# Patient Record
Sex: Male | Born: 1950 | Race: White | Hispanic: No | Marital: Married | Smoking: Former smoker
Health system: Southern US, Community
[De-identification: ages and names within clinical notes are randomized; demographics above are authoritative.]

## PROBLEM LIST (undated history)

## (undated) DIAGNOSIS — N289 Disorder of kidney and ureter, unspecified: Secondary | ICD-10-CM

## (undated) DIAGNOSIS — J189 Pneumonia, unspecified organism: Secondary | ICD-10-CM

## (undated) DIAGNOSIS — K219 Gastro-esophageal reflux disease without esophagitis: Secondary | ICD-10-CM

## (undated) DIAGNOSIS — I10 Essential (primary) hypertension: Secondary | ICD-10-CM

## (undated) DIAGNOSIS — G4730 Sleep apnea, unspecified: Secondary | ICD-10-CM

## (undated) DIAGNOSIS — D509 Iron deficiency anemia, unspecified: Secondary | ICD-10-CM

## (undated) DIAGNOSIS — D126 Benign neoplasm of colon, unspecified: Secondary | ICD-10-CM

## (undated) DIAGNOSIS — C689 Malignant neoplasm of urinary organ, unspecified: Secondary | ICD-10-CM

## (undated) DIAGNOSIS — Z5189 Encounter for other specified aftercare: Secondary | ICD-10-CM

## (undated) DIAGNOSIS — K635 Polyp of colon: Secondary | ICD-10-CM

## (undated) DIAGNOSIS — R51 Headache: Secondary | ICD-10-CM

## (undated) DIAGNOSIS — M797 Fibromyalgia: Secondary | ICD-10-CM

## (undated) DIAGNOSIS — K5790 Diverticulosis of intestine, part unspecified, without perforation or abscess without bleeding: Secondary | ICD-10-CM

## (undated) DIAGNOSIS — F32A Depression, unspecified: Secondary | ICD-10-CM

## (undated) DIAGNOSIS — C801 Malignant (primary) neoplasm, unspecified: Secondary | ICD-10-CM

## (undated) DIAGNOSIS — D649 Anemia, unspecified: Secondary | ICD-10-CM

## (undated) DIAGNOSIS — M1990 Unspecified osteoarthritis, unspecified site: Secondary | ICD-10-CM

## (undated) DIAGNOSIS — F329 Major depressive disorder, single episode, unspecified: Secondary | ICD-10-CM

## (undated) DIAGNOSIS — K900 Celiac disease: Secondary | ICD-10-CM

## (undated) DIAGNOSIS — K5520 Angiodysplasia of colon without hemorrhage: Secondary | ICD-10-CM

## (undated) DIAGNOSIS — K922 Gastrointestinal hemorrhage, unspecified: Secondary | ICD-10-CM

## (undated) DIAGNOSIS — IMO0001 Reserved for inherently not codable concepts without codable children: Secondary | ICD-10-CM

## (undated) HISTORY — DX: Diverticulosis of intestine, part unspecified, without perforation or abscess without bleeding: K57.90

## (undated) HISTORY — DX: Polyp of colon: K63.5

## (undated) HISTORY — PX: POLYPECTOMY: SHX149

## (undated) HISTORY — PX: COLONOSCOPY: SHX174

## (undated) HISTORY — DX: Benign neoplasm of colon, unspecified: D12.6

## (undated) HISTORY — DX: Encounter for other specified aftercare: Z51.89

## (undated) HISTORY — DX: Iron deficiency anemia, unspecified: D50.9

## (undated) HISTORY — DX: Celiac disease: K90.0

## (undated) HISTORY — DX: Fibromyalgia: M79.7

---
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
## 1982-02-14 HISTORY — PX: INNER EAR SURGERY: SHX679

## 1984-02-15 HISTORY — PX: VARICOSE VEIN SURGERY: SHX832

## 1998-02-14 HISTORY — PX: FOOT SURGERY: SHX648

## 2008-07-07 ENCOUNTER — Emergency Department (HOSPITAL_COMMUNITY): Admission: EM | Admit: 2008-07-07 | Discharge: 2008-07-07 | Payer: Self-pay | Admitting: Emergency Medicine

## 2009-08-21 ENCOUNTER — Other Ambulatory Visit: Payer: Self-pay | Admitting: Urology

## 2009-08-21 ENCOUNTER — Observation Stay (HOSPITAL_COMMUNITY): Admission: AD | Admit: 2009-08-21 | Discharge: 2009-08-22 | Payer: Self-pay | Admitting: Urology

## 2010-01-22 ENCOUNTER — Ambulatory Visit
Admission: RE | Admit: 2010-01-22 | Discharge: 2010-01-22 | Payer: Self-pay | Source: Home / Self Care | Attending: Urology | Admitting: Urology

## 2010-04-27 LAB — POCT I-STAT, CHEM 8
BUN: 21 mg/dL (ref 6–23)
Calcium, Ion: 1.19 mmol/L (ref 1.12–1.32)
Chloride: 103 mEq/L (ref 96–112)
Creatinine, Ser: 1 mg/dL (ref 0.4–1.5)
Glucose, Bld: 103 mg/dL — ABNORMAL HIGH (ref 70–99)
HCT: 34 % — ABNORMAL LOW (ref 39.0–52.0)
Hemoglobin: 11.6 g/dL — ABNORMAL LOW (ref 13.0–17.0)
Potassium: 3.7 mEq/L (ref 3.5–5.1)
Sodium: 140 mEq/L (ref 135–145)
TCO2: 28 mmol/L (ref 0–100)

## 2010-05-02 LAB — POCT I-STAT 4, (NA,K, GLUC, HGB,HCT)
Glucose, Bld: 107 mg/dL — ABNORMAL HIGH (ref 70–99)
HCT: 34 % — ABNORMAL LOW (ref 39.0–52.0)
Hemoglobin: 11.6 g/dL — ABNORMAL LOW (ref 13.0–17.0)
Potassium: 4.2 mEq/L (ref 3.5–5.1)
Sodium: 141 mEq/L (ref 135–145)

## 2010-05-25 LAB — URINALYSIS, ROUTINE W REFLEX MICROSCOPIC
Glucose, UA: NEGATIVE mg/dL
Ketones, ur: 15 mg/dL — AB
Nitrite: POSITIVE — AB
Protein, ur: 100 mg/dL — AB
Specific Gravity, Urine: 1.015 (ref 1.005–1.030)
Urobilinogen, UA: 1 mg/dL (ref 0.0–1.0)
pH: 5.5 (ref 5.0–8.0)

## 2010-05-25 LAB — DIFFERENTIAL
Basophils Absolute: 0 10*3/uL (ref 0.0–0.1)
Basophils Relative: 0 % (ref 0–1)
Eosinophils Absolute: 0 10*3/uL (ref 0.0–0.7)
Eosinophils Relative: 1 % (ref 0–5)
Lymphocytes Relative: 32 % (ref 12–46)
Lymphs Abs: 1.7 10*3/uL (ref 0.7–4.0)
Monocytes Absolute: 0.5 10*3/uL (ref 0.1–1.0)
Monocytes Relative: 9 % (ref 3–12)
Neutro Abs: 3.1 10*3/uL (ref 1.7–7.7)
Neutrophils Relative %: 57 % (ref 43–77)

## 2010-05-25 LAB — COMPREHENSIVE METABOLIC PANEL
ALT: 25 U/L (ref 0–53)
AST: 22 U/L (ref 0–37)
Albumin: 3.8 g/dL (ref 3.5–5.2)
Alkaline Phosphatase: 54 U/L (ref 39–117)
BUN: 10 mg/dL (ref 6–23)
CO2: 30 mEq/L (ref 19–32)
Calcium: 9.4 mg/dL (ref 8.4–10.5)
Chloride: 100 mEq/L (ref 96–112)
Creatinine, Ser: 0.9 mg/dL (ref 0.4–1.5)
GFR calc Af Amer: >60 mL/min (ref >60)
GFR calc non Af Amer: >60 mL/min (ref >60)
Glucose, Bld: 109 mg/dL — ABNORMAL HIGH (ref 70–99)
Potassium: 3.7 mEq/L (ref 3.5–5.1)
Sodium: 137 mEq/L (ref 135–145)
Total Bilirubin: 0.8 mg/dL (ref 0.3–1.2)
Total Protein: 6.5 g/dL (ref 6.0–8.3)

## 2010-05-25 LAB — URINE CULTURE: Colony Count: 5000

## 2010-05-25 LAB — CBC
HCT: 36 % — ABNORMAL LOW (ref 39.0–52.0)
Hemoglobin: 12.5 g/dL — ABNORMAL LOW (ref 13.0–17.0)
MCHC: 34.7 g/dL (ref 30.0–36.0)
MCV: 91.9 fL (ref 78.0–100.0)
Platelets: 265 10*3/uL (ref 150–400)
RBC: 3.92 MIL/uL — ABNORMAL LOW (ref 4.22–5.81)
RDW: 12.5 % (ref 11.5–15.5)
WBC: 5.3 10*3/uL (ref 4.0–10.5)

## 2010-05-25 LAB — URINE MICROSCOPIC-ADD ON

## 2010-05-27 ENCOUNTER — Ambulatory Visit (HOSPITAL_BASED_OUTPATIENT_CLINIC_OR_DEPARTMENT_OTHER)
Admission: RE | Admit: 2010-05-27 | Discharge: 2010-05-28 | Disposition: A | Discharge status: Home / Self Care | Payer: BC Managed Care – HMO | Source: Ambulatory Visit | Attending: Urology | Admitting: Urology

## 2010-05-27 ENCOUNTER — Other Ambulatory Visit: Payer: Self-pay | Admitting: Urology

## 2010-05-27 DIAGNOSIS — I10 Essential (primary) hypertension: Secondary | ICD-10-CM | POA: Insufficient documentation

## 2010-05-27 DIAGNOSIS — K219 Gastro-esophageal reflux disease without esophagitis: Secondary | ICD-10-CM | POA: Insufficient documentation

## 2010-05-27 DIAGNOSIS — G4733 Obstructive sleep apnea (adult) (pediatric): Secondary | ICD-10-CM | POA: Insufficient documentation

## 2010-05-27 DIAGNOSIS — C659 Malignant neoplasm of unspecified renal pelvis: Secondary | ICD-10-CM | POA: Insufficient documentation

## 2010-05-27 DIAGNOSIS — R319 Hematuria, unspecified: Secondary | ICD-10-CM | POA: Insufficient documentation

## 2010-05-27 DIAGNOSIS — Z01812 Encounter for preprocedural laboratory examination: Secondary | ICD-10-CM | POA: Insufficient documentation

## 2010-05-27 DIAGNOSIS — Z79899 Other long term (current) drug therapy: Secondary | ICD-10-CM | POA: Insufficient documentation

## 2010-05-27 LAB — POCT I-STAT 4, (NA,K, GLUC, HGB,HCT)
Glucose, Bld: 100 mg/dL — ABNORMAL HIGH (ref 70–99)
HCT: 29 % — ABNORMAL LOW (ref 39.0–52.0)
Hemoglobin: 9.9 g/dL — ABNORMAL LOW (ref 13.0–17.0)
Potassium: 5.5 mEq/L — ABNORMAL HIGH (ref 3.5–5.1)
Sodium: 136 mEq/L (ref 135–145)

## 2010-06-03 NOTE — Op Note (Signed)
  NAME:  George Jacobson, George Jacobson NO.:  [id number]  MEDICAL RECORD NO.:  [id number]          PATIENT TYPE:  AMB  LOCATION:  [facility]                         FACILITY:  [facility]  PHYSICIAN:  Maretta Bees. 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  PROCEDURES:  Cystoscopy, right ureteroscopy, evacuation of old clot from renal pelvis, biopsy of ? clot versus tissue, laser of right renal pelvis, right retrograde pyelogram with interpretation.  SURGEON:  Maretta Bees. Vonita Moss, M.D.  ANESTHESIA:  General.  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  He is brought back to the OR today because of recent episode of bleeding.  He has had no bleeding for several days.  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  DESCRIPTION OF PROCEDURE:  The patient was brought to the operating room, placed in a lithotomy position and the external genitalia prepped and draped in usual fashion.  He was cystoscoped and bladder was unremarkable.  I passed a guidewire easily up to the right renal pelvis and over that placed a digital ureteroscopic access sheath.  I then performed digital ureteroscopy and encountered the right renal pelvis to be filled with light brown old clot.  It took a lot of slow tedious irrigation and basketing to remove this clot.  I spent most of the time doing that.  After that, I did find the renal pelvis and caliceal system to be clear of tumor except for one area where I believe he had previous tumor.  There was possible soft tissue there with adherent clot.  I could not make a definitive diagnosis.  I  tried to basket some that tissue for biopsy and it was sent to the lab.  I then used the holmium laser to laser this area.  At this point, there was minimal bleeding and the ureteroscope and access sheath were removed.  As with his last procedure, I elected to leave out double-J catheters, he does not tolerate those well.  I will keep him for overnight observation because of his previous severe episodes of post ureteroscopic pain.  He tolerated the procedure well.     Maretta Bees. Vonita Moss, M.D.     LJP/MEDQ  D:  05/27/2010  T:  05/27/2010  Job:  [id number]  Electronically Signed by Larey Dresser M.D. on 06/03/2010 01:35:54 PM

## 2010-09-09 ENCOUNTER — Other Ambulatory Visit: Payer: Self-pay | Admitting: Urology

## 2010-09-09 ENCOUNTER — Ambulatory Visit (HOSPITAL_COMMUNITY): Payer: BC Managed Care – PPO

## 2010-09-09 ENCOUNTER — Observation Stay (HOSPITAL_COMMUNITY)
Admission: RE | Admit: 2010-09-09 | Discharge: 2010-09-11 | Disposition: A | Discharge status: Home / Self Care | Payer: BC Managed Care – PPO | Source: Ambulatory Visit | Attending: Urology | Admitting: Urology

## 2010-09-09 DIAGNOSIS — N3289 Other specified disorders of bladder: Secondary | ICD-10-CM | POA: Insufficient documentation

## 2010-09-09 DIAGNOSIS — G4730 Sleep apnea, unspecified: Secondary | ICD-10-CM | POA: Insufficient documentation

## 2010-09-09 DIAGNOSIS — Z79899 Other long term (current) drug therapy: Secondary | ICD-10-CM | POA: Insufficient documentation

## 2010-09-09 DIAGNOSIS — Z01810 Encounter for preprocedural cardiovascular examination: Secondary | ICD-10-CM | POA: Insufficient documentation

## 2010-09-09 DIAGNOSIS — Z7982 Long term (current) use of aspirin: Secondary | ICD-10-CM | POA: Insufficient documentation

## 2010-09-09 DIAGNOSIS — C659 Malignant neoplasm of unspecified renal pelvis: Principal | ICD-10-CM | POA: Insufficient documentation

## 2010-09-09 DIAGNOSIS — Z01818 Encounter for other preprocedural examination: Secondary | ICD-10-CM | POA: Insufficient documentation

## 2010-09-09 DIAGNOSIS — R109 Unspecified abdominal pain: Secondary | ICD-10-CM | POA: Insufficient documentation

## 2010-09-09 DIAGNOSIS — J984 Other disorders of lung: Secondary | ICD-10-CM | POA: Insufficient documentation

## 2010-09-09 DIAGNOSIS — I10 Essential (primary) hypertension: Secondary | ICD-10-CM | POA: Insufficient documentation

## 2010-09-09 DIAGNOSIS — K219 Gastro-esophageal reflux disease without esophagitis: Secondary | ICD-10-CM | POA: Insufficient documentation

## 2010-09-09 LAB — BASIC METABOLIC PANEL
BUN: 22 mg/dL (ref 6–23)
CO2: 27 mEq/L (ref 19–32)
Calcium: 9.5 mg/dL (ref 8.4–10.5)
Chloride: 101 mEq/L (ref 96–112)
Creatinine, Ser: 1.06 mg/dL (ref 0.50–1.35)
GFR calc Af Amer: >60 mL/min (ref >60)
GFR calc non Af Amer: >60 mL/min (ref >60)
Glucose, Bld: 90 mg/dL (ref 70–99)
Potassium: 3.7 mEq/L (ref 3.5–5.1)
Sodium: 136 mEq/L (ref 135–145)

## 2010-09-09 LAB — SURGICAL PCR SCREEN
MRSA, PCR: NEGATIVE
Staphylococcus aureus: POSITIVE — AB

## 2010-09-09 LAB — CBC
HCT: 33.4 % — ABNORMAL LOW (ref 39.0–52.0)
Hemoglobin: 11.7 g/dL — ABNORMAL LOW (ref 13.0–17.0)
MCH: 30.5 pg (ref 26.0–34.0)
MCHC: 35 g/dL (ref 30.0–36.0)
MCV: 87 fL (ref 78.0–100.0)
Platelets: 214 10*3/uL (ref 150–400)
RBC: 3.84 MIL/uL — ABNORMAL LOW (ref 4.22–5.81)
RDW: 12.3 % (ref 11.5–15.5)
WBC: 5.2 10*3/uL (ref 4.0–10.5)

## 2010-09-10 LAB — BASIC METABOLIC PANEL
BUN: 17 mg/dL (ref 6–23)
CO2: 27 mEq/L (ref 19–32)
Calcium: 8.4 mg/dL (ref 8.4–10.5)
Chloride: 103 mEq/L (ref 96–112)
Creatinine, Ser: 1.01 mg/dL (ref 0.50–1.35)
GFR calc Af Amer: >60 mL/min (ref >60)
GFR calc non Af Amer: >60 mL/min (ref >60)
Glucose, Bld: 170 mg/dL — ABNORMAL HIGH (ref 70–99)
Potassium: 3.8 mEq/L (ref 3.5–5.1)
Sodium: 137 mEq/L (ref 135–145)

## 2010-09-10 NOTE — Op Note (Signed)
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  PROCEDURE: 1. Cystoscopy. 2. Right retrograde pyelography with interpretation. 3. Right ureteroscopy with holmium laser ablation of renal pelvic     tumor. 4. Biopsy of right renal pelvic tumor. 5. Right renal pelvic washing for cytology. 6. Right ureteral stent placement (5 x 24 Polaris)  SURGEON:  Heloise Purpura, M.D.  ANESTHESIA:  General.  COMPLICATIONS:  None.  ESTIMATED BLOOD LOSS:  Minimal.  SPECIMENS: 1. Biopsy of right renal pelvic tumor. 2. Cytology from washing of right renal pelvis.  DISPOSITION OF SPECIMENS:  To pathology.  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 Vonita Moss.  He has undergone multiple laser ablation procedures and presents today for further surveillance and evaluation and possible further laser ablation.  This is his initial evaluation by me.  The potential risks, complications, and alternative treatment options associated with the above procedures were discussed in detail and informed consent obtained.  DESCRIPTION OF PROCEDURE:  The patient was taken to the operating room and a general anesthetic was administered.  He was given preoperative antibiotics, placed in the dorsal lithotomy position, and prepped and draped in the usual sterile fashion.  Next, a preoperative time-out was performed.  Cystourethroscopy was then performed, which revealed  a normal anterior and posterior urethra.  The bladder was then systematically inspected with both 12 and 70-degree lens and revealed no evidence of any bladder tumors, stones, or other mucosal pathology.  The ureteral orifices were in their expected locations with some blood seen from the right ureteral orifice and clear efflux from the left ureteral orifice.  A 6-French ureteral catheter was then used to intubate the right ureteral orifice and Omnipaque contrast was injected.  This demonstrated no ureteral abnormalities, but a large filling defect within the interpolar region of the right renal pelvis.  A 0.038 sensor guidewire was then advanced up the right ureter into the right renal pelvis under fluoroscopic guidance and a 12/14 ACMI ureteral access sheath was advanced over the wire using Seldinger technique up into the proximal ureter.  The digital flexible ureteroscope was then advanced through the ureteral access sheath into the renal pelvis and the renal collecting system was examined.  There was noted to be a large 2.5 to 3 cm papillary tumor in the renal pelvis with further papillary tumor located in an interpolar calyx.  A saline washing was obtained for cytology and biopsies were obtained with both the ureteral biopsy forceps and a Gemini stone basket.  The remaining tumor was then lasered using narrow band imaging to assist with visualization.  A 200-micron holmium laser fiber  on a setting of 1 joule at a rate of 6 Hz.  Once the tumor was sufficiently lasered, multiple of fragments of tumor were removed with the stone basket.  Reinspection revealed a decent amount of clot in the renal pelvis and this also was removed to the best of my ability.  There was noted to be some remaining frondular tissue in the interpolar calyx, which was unable to be accessed with the ureteroscope. It was unclear whether this was attached to the urothelium or simply free-floating frondular tissue  from prior ablated tissue.  At this point, it was decided to place a ureteral stent and to await the biopsy results and possibly proceed with further endoscopic evaluation versus more definitive major surgery for treatment pending these results.  A 0.038 sensor guidewire was then advanced up into the renal pelvis and the ureteral access sheath was removed.  This wire was back loaded on the cystoscope and a 5 x 24 double-J Polaris ureteral stent was advanced over the wire using Seldinger technique and positioned appropriately under fluoroscopic and cystoscopic guidance.  The wire was removed with a good curl noted in the renal pelvis.  Based on the fact that the patient did have significant hematuria, it was decided to leave an 55- [place] Foley catheter.  He tolerated procedure well without complications, was able to be transferred to recovery unit in satisfactory condition.     Heloise Purpura, MD     LB/MEDQ  D:  09/09/2010  T:  09/09/2010  Job:  [id number]  Electronically Signed by Heloise Purpura MD on 09/10/2010 11:03:32 PM

## 2010-09-12 NOTE — Discharge Summary (Signed)
  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  DISCHARGE DIAGNOSES: 1. 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 2. Pulmonary nodule.  HISTORY:  Mr. George Jacobson is a 60 year old gentleman who has a history of low- grade urothelial carcinoma of the right renal pelvis.  He has previously been treated endoscopically with laser ablation by Dr. Larey Dresser. He presented to me for initial consultation recently after Dr. Enos Fling retirement and it was recommended that he undergo surveillance ureteroscopy at this time.  HOSPITAL COURSE:  On September 09, 2010, he was taken to the operating room and underwent right ureteroscopy which revealed a 3-cm papillary tumor. Multiple biopsies were obtained and the tumor was ablated with holmium laser ablation.  A ureteral stent was left indwelling as was a Foley catheter and he was admitted for observation that evening.  He remained hemodynamically stable and his renal function was checked the following morning and remained stable at 1.01.  His urine cleared overnight.  His catheter was able to be removed on postoperative day #1.  He was able to pass a voiding trial and was to be discharged home except that he developed severe right flank pain.  This persisted throughout the day but was finally able to be controlled with po and IV pain medications. He was subsequently able to be discharged on  postoperative day #2 in stable condition.  DISPOSITION:  Home.  DISCHARGE MEDICATIONS:  He will resume all of his regular home medications and has been provided a prescription to take Vicodin for pain and  ciprofloxacin for antibiotic prophylaxis.  DISCHARGE INSTRUCTIONS:  He was instructed to resume a regular diet and activity as tolerated, although recommended to refrain from any heavy lifting or strenuous activity.  FOLLOWUP:  He will follow up in 2 weeks to review his biopsy results.  I have recommended that he proceed at the very least a repeat endoscopic evaluation in approximately 3 to 4 weeks.  He understands that if he does have a high-grade tumor that he would necessitate a nephroureterectomy.  ADDENDUM:  On the patient's preoperative chest x-ray, he was noted to have a 4-mm left upper lobe pulmonary nodule.  This was felt to potentially represent a vascular abnormality rather than a true nodule. He apparently has had prior chest x-rays in [place] and has been told of this finding previously.  It was not felt to represent a malignancy.  However, I will plan to send a note to the patient's primary care physician, Dr. Truddie Jacobson, notifying them of this result and to allow him to proceed with further surveillance or workup as necessary.  Heloise Purpura, MD     LB/MEDQ  D:  09/10/2010  T:  09/10/2010  Job:  [id number]  Electronically Signed by Heloise Purpura MD on 09/12/2010 11:01:21 AM

## 2010-09-24 ENCOUNTER — Other Ambulatory Visit: Payer: Self-pay | Admitting: Urology

## 2010-09-24 ENCOUNTER — Encounter (HOSPITAL_COMMUNITY): Payer: BC Managed Care – PPO

## 2010-09-24 LAB — CBC
HCT: 32.4 % — ABNORMAL LOW (ref 39.0–52.0)
Hemoglobin: 11.2 g/dL — ABNORMAL LOW (ref 13.0–17.0)
MCH: 30.1 pg (ref 26.0–34.0)
MCHC: 34.6 g/dL (ref 30.0–36.0)
MCV: 87.1 fL (ref 78.0–100.0)
Platelets: 264 10*3/uL (ref 150–400)
RBC: 3.72 MIL/uL — ABNORMAL LOW (ref 4.22–5.81)
RDW: 11.8 % (ref 11.5–15.5)
WBC: 6.1 10*3/uL (ref 4.0–10.5)

## 2010-09-24 LAB — BASIC METABOLIC PANEL
BUN: 21 mg/dL (ref 6–23)
CO2: 30 mEq/L (ref 19–32)
Calcium: 9.3 mg/dL (ref 8.4–10.5)
Chloride: 97 mEq/L (ref 96–112)
Creatinine, Ser: 1.17 mg/dL (ref 0.50–1.35)
GFR calc Af Amer: >60 mL/min (ref >60)
GFR calc non Af Amer: >60 mL/min (ref >60)
Glucose, Bld: 89 mg/dL (ref 70–99)
Potassium: 3.2 mEq/L — ABNORMAL LOW (ref 3.5–5.1)
Sodium: 136 mEq/L (ref 135–145)

## 2010-09-24 LAB — SURGICAL PCR SCREEN
MRSA, PCR: NEGATIVE
Staphylococcus aureus: NEGATIVE

## 2010-10-04 ENCOUNTER — Observation Stay (HOSPITAL_COMMUNITY)
Admission: RE | Admit: 2010-10-04 | Discharge: 2010-10-05 | Disposition: A | Discharge status: Home / Self Care | Payer: BC Managed Care – PPO | Source: Ambulatory Visit | Attending: Urology | Admitting: Urology

## 2010-10-04 DIAGNOSIS — Z01812 Encounter for preprocedural laboratory examination: Secondary | ICD-10-CM | POA: Insufficient documentation

## 2010-10-04 DIAGNOSIS — G4733 Obstructive sleep apnea (adult) (pediatric): Secondary | ICD-10-CM | POA: Insufficient documentation

## 2010-10-04 DIAGNOSIS — Z01810 Encounter for preprocedural cardiovascular examination: Secondary | ICD-10-CM | POA: Insufficient documentation

## 2010-10-04 DIAGNOSIS — I10 Essential (primary) hypertension: Secondary | ICD-10-CM | POA: Insufficient documentation

## 2010-10-04 DIAGNOSIS — C659 Malignant neoplasm of unspecified renal pelvis: Principal | ICD-10-CM | POA: Insufficient documentation

## 2010-10-04 NOTE — Op Note (Signed)
NAME:  George Jacobson, George Jacobson NO.:  [id number]  MEDICAL RECORD NO.:  [id number]  LOCATION:  1406                         FACILITY:  [facility]  PHYSICIAN:  Heloise Purpura, MD      DATE OF BIRTH:  Jun 25, 1950  DATE OF PROCEDURE:  10/04/2010 DATE OF DISCHARGE:                              OPERATIVE REPORT   PREOPERATIVE DIAGNOSIS:  Right renal pelvic tumor with history of low- grade urothelial carcinoma.  POSTOPERATIVE DIAGNOSIS:  Right renal pelvic tumor with history of low- grade urothelial carcinoma.  PROCEDURE: 1. Cystoscopy. 2. Right ureteroscopy with laser ablation of urothelial tumor. 3. Right ureteral stent placement (5 x 24 Polaris).  SURGEON:  Heloise Purpura, M.D.  ANESTHESIA:  General.  COMPLICATIONS:  None.  ESTIMATED BLOOD LOSS:  Minimal.  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  He initially presented to me last month and underwent an evaluation including an ureteroscopy with a fairly large papillary tumor.  Biopsies from this procedure demonstrated recurrent low-grade urothelial carcinoma.  The tumor was not able to be completely ablated and he presents today for further evaluation and possible ablation to determine if endoscopic therapy would be an appropriate approach to continue versus a more radical surgery.  The potential risks, complications, and alternative treatment options were discussed in detail and informed consent obtained.  DESCRIPTION OF PROCEDURE:  The patient was taken to the operating room and a general anesthetic was administered.  He was given preoperative antibiotics, placed in the dorsal lithotomy position, prepped and draped in the usual sterile fashion.  Next, cystourethroscopy was performed which revealed an unremarkable anterior and posterior urethra. Inspection of the bladder revealed no  evidence of any bladder tumors, stones, or other mucosal pathology.  The ureteral stent was identified and brought out to the urethral meatus with the aid of the flexible graspers and a 0.038 sensor guidewire was advanced through the stent up into the right renal pelvis under fluoroscopic guidance.  A 12/14 ACMI ureteral access sheath was then advanced over the wire to the proximal ureter and the digital flexible ureteroscope was advanced through the ureteral access sheath up into the right renal pelvis.  In the interpolar region of the kidney, there was noted to be some necrotic tumor from the prior ablation.  Inspection into this calix revealed significant amount of residual papillary tumor.  It was decided to make an attempt to proceed with further laser ablation based on the excellent visualization at this point.  A 200 micron holmium laser fiber was advanced through the ureteroscope and on a setting of 1 joule and 6 Hz, ablation was performed.  Unfortunately, based on the location of the tumor, it was not able to be completely accessed by ureteroscopic meansand it was eventually felt that continued ureteroscopic management would not be optimal.  At this point, laser ablation was discontinued.  There did not appear to be excessive bleeding from the renal pelvis.  The access sheath was removed after a 0.038 sensor guidewire was replaced. The wire was then back loaded on the cystoscope  and a new 5 x 24 Polaris ureteral stent was advanced over the wire using Seldinger technique.  It was positioned appropriately under fluoroscopic and cystoscopic guidance and the wire was removed.  The patient's bladder was emptied and the procedure was ended.     Heloise Purpura, MD     LB/MEDQ  D:  10/04/2010  T:  10/04/2010  Job:  [id number]  Electronically Signed by Heloise Purpura MD on 10/04/2010 06:50:09 PM

## 2010-10-05 LAB — BASIC METABOLIC PANEL
BUN: 13 mg/dL (ref 6–23)
CO2: 34 mEq/L — ABNORMAL HIGH (ref 19–32)
Calcium: 9.4 mg/dL (ref 8.4–10.5)
Chloride: 101 mEq/L (ref 96–112)
Creatinine, Ser: 1.07 mg/dL (ref 0.50–1.35)
GFR calc Af Amer: >60 mL/min (ref >60)
GFR calc non Af Amer: >60 mL/min (ref >60)
Glucose, Bld: 110 mg/dL — ABNORMAL HIGH (ref 70–99)
Potassium: 3.7 mEq/L (ref 3.5–5.1)
Sodium: 139 mEq/L (ref 135–145)

## 2010-10-09 NOTE — Discharge Summary (Signed)
  NAME:  George Jacobson, George Jacobson NO.:  [id number]  MEDICAL RECORD NO.:  [id number]  LOCATION:  1406                         FACILITY:  [facility]  PHYSICIAN:  Heloise Purpura, MD      DATE OF BIRTH:  Aug 24, 1950  DATE OF ADMISSION:  10/04/2010 DATE OF DISCHARGE:  10/05/2010                              DISCHARGE SUMMARY   ADMISSION DIAGNOSIS:  Right renal pelvic urothelial carcinoma.  POSTOPERATIVE/DISCHARGE DIAGNOSIS:  Right renal pelvic urothelial carcinoma.  HISTORY AND PHYSICAL:  For full details, please see admission history and physical.  Briefly, [name] is a 60 year old gentleman with a low- grade urothelial carcinoma of the right renal pelvis.  He has undergone ureteroscopic laser ablation procedures in the past and presents today for a second stage procedure to see if his tumor can be completely ablated endoscopically.  HOSPITAL COURSE:  On October 04, 2010, the patient was taken to the operating room.  His indwelling ureteral stent was removed and digital flexible ureteroscopy was performed, which revealed a large amount of residual urothelial carcinoma.  Attempts were made to further ablate this tumor with holmium laser.  However, due to the size as well as the location of the tumor, it was not able to be completely ablated.  At this point, it was decided to stop the procedure as it became evident that the patient would require more aggressive therapy with a nephroureterectomy.  He tolerated the procedure well and postoperatively was transferred to a regular hospital room following recovery from anesthesia.  He was able to begin ambulating and resume regular diet. He initially was noted to have fairly red urine when voiding, but this subsequently cleared overnight.  He was felt to be stable for discharge home the following morning after surgery.  DISPOSITION:  Home.  DISCHARGE MEDICATIONS:  He will resume all of his regular home medications.  He has also been  provided prescriptions to take Vicodin as needed for pain, Pyridium as needed for dysuria, and Cipro to take for antibiotic prophylaxis.  DISCHARGE INSTRUCTIONS:  He has been instructed to resume activity as tolerated and to resume a regular diet.  FOLLOWUP:  He will follow up in approximately 2 weeks for further postoperative evaluation and to discuss proceeding with a nephroureterectomy for definitive management of his renal pelvic tumor.     Heloise Purpura, MD     LB/MEDQ  D:  10/05/2010  T:  10/05/2010  Job:  [id number]  Electronically Signed by Heloise Purpura MD on 10/09/2010 06:43:17 PM

## 2010-10-16 DIAGNOSIS — C801 Malignant (primary) neoplasm, unspecified: Secondary | ICD-10-CM

## 2010-10-16 HISTORY — PX: LAPAROSCOPIC NEPHRECTOMY: SUR781

## 2010-10-16 HISTORY — DX: Malignant (primary) neoplasm, unspecified: C80.1

## 2010-11-02 ENCOUNTER — Other Ambulatory Visit: Payer: Self-pay | Admitting: Urology

## 2010-11-02 ENCOUNTER — Encounter (HOSPITAL_COMMUNITY): Payer: BC Managed Care – PPO

## 2010-11-02 LAB — BASIC METABOLIC PANEL
BUN: 21 mg/dL (ref 6–23)
CO2: 29 mEq/L (ref 19–32)
Calcium: 9.3 mg/dL (ref 8.4–10.5)
Chloride: 98 mEq/L (ref 96–112)
Creatinine, Ser: 1.04 mg/dL (ref 0.50–1.35)
GFR calc Af Amer: >60 mL/min (ref >60)
GFR calc non Af Amer: >60 mL/min (ref >60)
Glucose, Bld: 84 mg/dL (ref 70–99)
Potassium: 3.7 mEq/L (ref 3.5–5.1)
Sodium: 135 mEq/L (ref 135–145)

## 2010-11-02 LAB — CBC
HCT: 33.9 % — ABNORMAL LOW (ref 39.0–52.0)
Hemoglobin: 11.8 g/dL — ABNORMAL LOW (ref 13.0–17.0)
MCH: 30.8 pg (ref 26.0–34.0)
MCHC: 34.8 g/dL (ref 30.0–36.0)
MCV: 88.5 fL (ref 78.0–100.0)
Platelets: 225 10*3/uL (ref 150–400)
RBC: 3.83 MIL/uL — ABNORMAL LOW (ref 4.22–5.81)
RDW: 12.6 % (ref 11.5–15.5)
WBC: 5.5 10*3/uL (ref 4.0–10.5)

## 2010-11-02 LAB — SURGICAL PCR SCREEN
MRSA, PCR: NEGATIVE
Staphylococcus aureus: NEGATIVE

## 2010-11-02 LAB — ABO/RH: ABO/RH(D): O POS

## 2010-11-08 ENCOUNTER — Other Ambulatory Visit: Payer: Self-pay | Admitting: Urology

## 2010-11-08 ENCOUNTER — Inpatient Hospital Stay (HOSPITAL_COMMUNITY)
Admission: RE | Admit: 2010-11-08 | Discharge: 2010-11-12 | DRG: 303 | Disposition: A | Discharge status: Home / Self Care | Payer: BC Managed Care – PPO | Source: Ambulatory Visit | Attending: Urology | Admitting: Urology

## 2010-11-08 DIAGNOSIS — K219 Gastro-esophageal reflux disease without esophagitis: Secondary | ICD-10-CM | POA: Diagnosis present

## 2010-11-08 DIAGNOSIS — F3289 Other specified depressive episodes: Secondary | ICD-10-CM | POA: Diagnosis present

## 2010-11-08 DIAGNOSIS — M129 Arthropathy, unspecified: Secondary | ICD-10-CM | POA: Diagnosis present

## 2010-11-08 DIAGNOSIS — Z01812 Encounter for preprocedural laboratory examination: Secondary | ICD-10-CM

## 2010-11-08 DIAGNOSIS — N200 Calculus of kidney: Secondary | ICD-10-CM | POA: Diagnosis present

## 2010-11-08 DIAGNOSIS — E7800 Pure hypercholesterolemia, unspecified: Secondary | ICD-10-CM | POA: Diagnosis present

## 2010-11-08 DIAGNOSIS — Z7982 Long term (current) use of aspirin: Secondary | ICD-10-CM

## 2010-11-08 DIAGNOSIS — J45909 Unspecified asthma, uncomplicated: Secondary | ICD-10-CM | POA: Diagnosis present

## 2010-11-08 DIAGNOSIS — C659 Malignant neoplasm of unspecified renal pelvis: Principal | ICD-10-CM | POA: Diagnosis present

## 2010-11-08 DIAGNOSIS — R112 Nausea with vomiting, unspecified: Secondary | ICD-10-CM | POA: Diagnosis not present

## 2010-11-08 DIAGNOSIS — F329 Major depressive disorder, single episode, unspecified: Secondary | ICD-10-CM | POA: Diagnosis present

## 2010-11-08 DIAGNOSIS — G4733 Obstructive sleep apnea (adult) (pediatric): Secondary | ICD-10-CM | POA: Diagnosis present

## 2010-11-08 DIAGNOSIS — I10 Essential (primary) hypertension: Secondary | ICD-10-CM | POA: Diagnosis present

## 2010-11-08 LAB — BASIC METABOLIC PANEL
BUN: 16 mg/dL (ref 6–23)
CO2: 27 mEq/L (ref 19–32)
Calcium: 8.9 mg/dL (ref 8.4–10.5)
Chloride: 100 mEq/L (ref 96–112)
Creatinine, Ser: 1.34 mg/dL (ref 0.50–1.35)
GFR calc Af Amer: >60 mL/min (ref >60)
GFR calc non Af Amer: 54 mL/min — ABNORMAL LOW (ref >60)
Glucose, Bld: 123 mg/dL — ABNORMAL HIGH (ref 70–99)
Potassium: 3.9 mEq/L (ref 3.5–5.1)
Sodium: 136 mEq/L (ref 135–145)

## 2010-11-08 LAB — HEMOGLOBIN AND HEMATOCRIT, BLOOD
HCT: 30.8 % — ABNORMAL LOW (ref 39.0–52.0)
Hemoglobin: 10.8 g/dL — ABNORMAL LOW (ref 13.0–17.0)

## 2010-11-08 LAB — TYPE AND SCREEN
ABO/RH(D): O POS
Antibody Screen: NEGATIVE

## 2010-11-09 LAB — CBC
HCT: 29 % — ABNORMAL LOW (ref 39.0–52.0)
Hemoglobin: 10.3 g/dL — ABNORMAL LOW (ref 13.0–17.0)
MCH: 31.7 pg (ref 26.0–34.0)
MCHC: 35.5 g/dL (ref 30.0–36.0)
MCV: 89.2 fL (ref 78.0–100.0)
Platelets: 154 10*3/uL (ref 150–400)
RBC: 3.25 MIL/uL — ABNORMAL LOW (ref 4.22–5.81)
RDW: 12.4 % (ref 11.5–15.5)
WBC: 7 10*3/uL (ref 4.0–10.5)

## 2010-11-09 LAB — BASIC METABOLIC PANEL
BUN: 12 mg/dL (ref 6–23)
CO2: 29 mEq/L (ref 19–32)
Calcium: 8.3 mg/dL — ABNORMAL LOW (ref 8.4–10.5)
Chloride: 100 mEq/L (ref 96–112)
Creatinine, Ser: 1.52 mg/dL — ABNORMAL HIGH (ref 0.50–1.35)
GFR calc Af Amer: 57 mL/min — ABNORMAL LOW (ref >60)
GFR calc non Af Amer: 47 mL/min — ABNORMAL LOW (ref >60)
Glucose, Bld: 129 mg/dL — ABNORMAL HIGH (ref 70–99)
Potassium: 3.4 mEq/L — ABNORMAL LOW (ref 3.5–5.1)
Sodium: 134 mEq/L — ABNORMAL LOW (ref 135–145)

## 2010-11-10 LAB — CBC
HCT: 27.5 % — ABNORMAL LOW (ref 39.0–52.0)
Hemoglobin: 9.5 g/dL — ABNORMAL LOW (ref 13.0–17.0)
MCH: 30.9 pg (ref 26.0–34.0)
MCHC: 34.5 g/dL (ref 30.0–36.0)
MCV: 89.6 fL (ref 78.0–100.0)
Platelets: 145 10*3/uL — ABNORMAL LOW (ref 150–400)
RBC: 3.07 MIL/uL — ABNORMAL LOW (ref 4.22–5.81)
RDW: 12.3 % (ref 11.5–15.5)
WBC: 7.6 10*3/uL (ref 4.0–10.5)

## 2010-11-10 LAB — BASIC METABOLIC PANEL
BUN: 11 mg/dL (ref 6–23)
CO2: 31 mEq/L (ref 19–32)
Calcium: 8.3 mg/dL — ABNORMAL LOW (ref 8.4–10.5)
Chloride: 96 mEq/L (ref 96–112)
Creatinine, Ser: 1.52 mg/dL — ABNORMAL HIGH (ref 0.50–1.35)
GFR calc Af Amer: 57 mL/min — ABNORMAL LOW (ref >60)
GFR calc non Af Amer: 47 mL/min — ABNORMAL LOW (ref >60)
Glucose, Bld: 111 mg/dL — ABNORMAL HIGH (ref 70–99)
Potassium: 3.4 mEq/L — ABNORMAL LOW (ref 3.5–5.1)
Sodium: 132 mEq/L — ABNORMAL LOW (ref 135–145)

## 2010-11-10 LAB — CREATININE, FLUID (PLEURAL, PERITONEAL, JP DRAINAGE): Creat, Fluid: 1.5 mg/dL

## 2010-11-11 ENCOUNTER — Inpatient Hospital Stay (HOSPITAL_COMMUNITY): Payer: BC Managed Care – PPO

## 2010-11-11 LAB — BASIC METABOLIC PANEL
BUN: 9 mg/dL (ref 6–23)
CO2: 30 mEq/L (ref 19–32)
Calcium: 8.2 mg/dL — ABNORMAL LOW (ref 8.4–10.5)
Chloride: 98 mEq/L (ref 96–112)
Creatinine, Ser: 1.48 mg/dL — ABNORMAL HIGH (ref 0.50–1.35)
GFR calc Af Amer: 58 mL/min — ABNORMAL LOW (ref >60)
GFR calc non Af Amer: 48 mL/min — ABNORMAL LOW (ref >60)
Glucose, Bld: 114 mg/dL — ABNORMAL HIGH (ref 70–99)
Potassium: 3.5 mEq/L (ref 3.5–5.1)
Sodium: 134 mEq/L — ABNORMAL LOW (ref 135–145)

## 2010-11-11 MED ORDER — DIATRIZOATE MEGLUMINE 30 % UR SOLN
Freq: Once | URETHRAL | Status: AC | PRN
  Administered 2010-11-11: 200 mL

## 2010-11-15 NOTE — Op Note (Signed)
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  PROCEDURE:  Right robotic-assisted laparoscopic nephroureterectomy.  SURGEON:  Heloise Purpura, MD  ASSISTANT:  Delia Chimes, [facility]  ANESTHESIA:  General.  COMPLICATIONS:  None.  ESTIMATED BLOOD LOSS:  200 cc.  INTRAVENOUS FLUIDS:  2300 cc of crystalloid.  SPECIMENS:  Right kidney, ureter, and bladder cuff.  DISPOSITION:  Specimen to pathology.  DRAINS: 1. Number 19 Blake pelvic drain. 2. 18-French Foley catheter.  INDICATIONS:  Mr. Brotherton is a 60 year old gentleman, who has been followed by Dr. Larey Dresser in the past and was found to have a low- grade urothelial carcinoma of the right renal pelvis.  He underwent attempted management with holmium laser ablation, although was noted to have multiple recurrences.  I initially evaluated him a few months ago and took him to the operating room for further endoscopic evaluation and possible laser ablation.  He did have residual tumor and underwent ablation of a fairly large and relatively difficult tumor to completely ablate.  He was, therefore, taken back a few weeks later for further endoscopic evaluation and continued to have persistent tumor and it was clear at this point that the tumor was not easily accessible via ureteroscopic approach.  We, therefore, discussed options including percutaneous therapy versus a full nephroureterectomy for definitive management.  He elected the latter and the potential risks, complications, and alternative treatment options of  this procedure were performed.  Informed consent was obtained.  DESCRIPTION OF PROCEDURE:  The patient was taken to the operating room and a general anesthetic was administered.  He was given preoperative antibiotics, placed in the right modified flank position, and prepped and draped in the usual sterile fashion.  Next preoperative time-out was performed.  Site was selected off to the right of the umbilicus for placement of the camera port, which was placed using a standard open Hassan technique.  This allowed entry into the peritoneal cavity under direct vision and without difficulty.  A 12-mm port was then placed and a pneumoperitoneum established.  With the 0-degree lens, the abdomen was inspected.  There was no evidence of any intra-abdominal injuries or other abnormalities.  The remaining ports were then placed.  8 mm robotic ports were placed in the right upper quadrant, right lower quadrant, and right lower midline.  A 12-mm assistant port was placed in the upper midline.  These ports were placed under direct vision and without difficulty.  The surgical cart was then docked for the renal portion of the procedure. Using the cautery scissors, the white line of Toldt was incised allowing the ascending colon to be mobilized medially and the space between Gerota's fascia and the mesocolon to be developed.  The ureter and gonadal vein were identified  inferiorly and were lifted anteriorly off the psoas muscle.  Dissection then proceeded superiorly toward the renal hilum.  There was noted to be a mild amount of desmoplastic reaction around the ureter likely related to the patient's multiple endoscopic procedures and prior stents.  During the dissection superiorly toward the renal hilum, the gonadal artery was identified and was isolated and divided between Hem-o-lok clips.  The gonadal vein was also identified and was divided between multiple 5-mm Hem-o-lok clips.  Further dissection  superiorly revealed the main renal vein.  There was noted to be a branching renal artery with a lower pole artery as well as an upper pole artery extending just above the renal vein.  The renal artery branches were able to be isolated and were then divided after ligation with multiple 10 and 5 mm Hem-o-lok clips.  At this point, the renal vein was examined and it appeared to be flat.  No other obvious renal vessels were identified.  Three 10 mm Hem-o-lok clips were then placed onto the renal vein on the vena cava side and one 10-mm Hem-o-lok clip was placed in the kidney side and the vein was divided.  There was noted be some oozing and back bleeding from the kidney and on further dissection superiorly, there was noted to be another small upper pole renal artery which was ligated with 5-mm Hem-o-lok clip and divided. Gerota's fascia was entered superiorly thereby sparing the adrenal gland and the superior attachments to the liver were carefully divided.  The lateral attachments and posterior attachments of kidney were also divided allowing the kidney to be freely mobilized and dissection then proceeded down toward the pelvis.  The gonadal vein was identified inferiorly and was separated from the ureter and ligated with Hem-o-lok clips and divided.  The ureter was then dissected down to the level of the right iliac vessels and preparations were then made for the pelvic portion of the procedure.  At this point, hemostasis was ensured in the renal fossa and the surgical cart was undocked and then re-docked from the lateral inferior aspect of the patient.  The camera port remained. The camera port in the right lower quadrant became the right arm and the lower midline port became the left arm.  The remainder of the colon was then mobilized medially thereby exposing the retroperitoneum.  The bladder was reflected on the right side, allowing entry into space of Retzius and the vas deferens was  identified and divided after ligation with bipolar energy.  The bowel was retracted medially and the ureter was dissected down under the superior vesical artery to the bladder where it was seen to enter the bladder.  The bladder was then filled with 250 cc to confirm appropriate position and preparations were made for resection of the bladder cuff.  Of note, a Hem-o-lok clip had been placed on the ureter prior to removing the bladder cuff.  Using a combination of sharp and cautery dissection, the bladder cuff was then removed and a good bladder cuff was noted.  Prior to complete removal of the bladder cuff, a 3-0 V-Loc suture was placed into the apex of the bladder opening so as to avoid retraction of the bladder.  The remainder of the bladder cuff was then excised, allowing the specimen to be completely mobilized and attention turned to reconstruction of the bladder opening.  A 3-0 V-loc suture was used to perform a running mucosal closure and was then brought back in a second imbricating layer over  the opening in the bladder.  The bladder was then again filled with 250 cc of saline through the three-way catheter and the bladder repair appeared to be watertight.  It was then placed back to drainage.  A #15 Blake drain was then brought through the right lower quadrant port site and positioned appropriately in the pelvis as a perivesical drain.  It was secured to skin with a nylon suture.  The surgical cart was undocked.  The original camera port site was closed with a figure-of- eight 0 Vicryl suture placed laparoscopically with a suture passer. This specimen had been placed into an EndoCatch II bag for removal via the 12-mm upper midline port site which was extended inferiorly.  Prior to removal of all the remaining ports, hemostasis was ensured.  Once the upper midline incision was then extended, specimen was removed intact within the EndoCatch II bag and this opening was then closed  with 2 running #1 PDS sutures.  All incision sites were injected with Exparel and reapproximated at the skin level with 4-0 Monocryl subcuticular closures.  Dermabond was applied to the skin.  The patient appeared to tolerate the procedure well and without complications.  He was able to be extubated and transferred to recovery unit in satisfactory condition.     Heloise Purpura, MD     LB/MEDQ  D:  11/08/2010  T:  11/08/2010  Job:  [id number]  Electronically Signed by Heloise Purpura MD on 11/15/2010 10:30:59 AM

## 2010-11-15 NOTE — Discharge Summary (Signed)
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  HISTORY AND PHYSICAL:  For full details, please see admission history and physical.  Briefly, Mr. 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  He has undergone multiple attempts with endoscopic ablation and management and his tumor has been found to be unamenable to this therapy.  After discussing further options for treatment, he elected to proceed with definitive treatment with a nephroureterectomy. HOSPITAL COURSE:  On November 08, 2010, the patient was taken to the operating room and underwent a right robotic-assisted laparoscopic nephroureterectomy.  He tolerated this procedure well without complications.  Postoperatively, he was able to be transferred to a regular hospital room following recovery from anesthesia.  He remained hemodynamically stable and his hemoglobin postoperatively was 10.8. This remained stable throughout his hospital course.  His renal function did worsen initially as expected with his creatinine rising at 1.52, but stabilizing at 1.48 prior to discharge.  He did have significant postoperative pain and did require administration of a Dilaudid PCA. This helped to control his pain better and he was able to begin ambulating.  He initially had no return of bowel function for approximately 48 hours and was kept n.p.o.  By postoperative day 3, he was passing flatus and his diet was able to be advanced as tolerated until  he was tolerating a regular diet later that evening.  Also on postoperative day 3, he underwent a cystogram, which demonstrated no evidence of extravasation from the bladder and his Foley catheter was removed.  His pelvic drain output remained minimal and a creatinine level from this drain was consistent with serum.  Therefore, his drain was also removed.  By the morning of postoperative day 4, his pain was well-controlled with oral pain medication and he was ambulating without difficulty.  He met all discharge criteria and was able to be discharged home in excellent condition.  PATHOLOGY:  His surgical pathology returned and demonstrated a pTa Nx Mx low-grade papillary urothelial carcinoma of the right renal pelvis.  The pathology results were discussed with the patient during this hospitalization as well as the implications for follow-up and prognosis.  DISPOSITION:  Home.  DISCHARGE MEDICATIONS:  He will resume all of his regular home medications although has been told to refrain from supplement and aspirin use for 3 more days.  He has been provided a prescription to take Vicodin as needed for pain and told to hold off on his hydrocodone, which he does typically take for back pain until his postsurgical pain has resolved and he has stopped using Percocet.  DISCHARGE INSTRUCTIONS:  He has been instructed to be ambulatory, but specifically told  to refrain from any heavy lifting, strenuous activity, or driving.  He will plan to follow up as scheduled on November 17, 2010 for further postoperative evaluation and to recheck his renal function. He will then begin routine cancer surveillance.     Heloise Purpura, MD     LB/MEDQ  D:  11/12/2010  T:  11/12/2010  Job:  [id number]  cc:   Gaspar Skeeters, MD [facility], [place]  Electronically Signed by Heloise Purpura MD on 11/15/2010 10:31:03 AM

## 2011-03-21 ENCOUNTER — Emergency Department (HOSPITAL_COMMUNITY): Payer: BC Managed Care – PPO

## 2011-03-21 ENCOUNTER — Inpatient Hospital Stay (HOSPITAL_COMMUNITY)
Admission: EM | Admit: 2011-03-21 | Discharge: 2011-03-25 | DRG: 574 | Disposition: A | Discharge status: Home / Self Care | Payer: BC Managed Care – PPO | Attending: Internal Medicine | Admitting: Internal Medicine

## 2011-03-21 ENCOUNTER — Other Ambulatory Visit: Payer: Self-pay

## 2011-03-21 ENCOUNTER — Encounter (HOSPITAL_COMMUNITY): Payer: Self-pay | Admitting: *Deleted

## 2011-03-21 DIAGNOSIS — R259 Unspecified abnormal involuntary movements: Secondary | ICD-10-CM | POA: Diagnosis present

## 2011-03-21 DIAGNOSIS — C659 Malignant neoplasm of unspecified renal pelvis: Secondary | ICD-10-CM | POA: Diagnosis present

## 2011-03-21 DIAGNOSIS — N179 Acute kidney failure, unspecified: Secondary | ICD-10-CM | POA: Diagnosis present

## 2011-03-21 DIAGNOSIS — K2940 Chronic atrophic gastritis without bleeding: Secondary | ICD-10-CM | POA: Diagnosis present

## 2011-03-21 DIAGNOSIS — N289 Disorder of kidney and ureter, unspecified: Secondary | ICD-10-CM | POA: Diagnosis present

## 2011-03-21 DIAGNOSIS — R195 Other fecal abnormalities: Secondary | ICD-10-CM

## 2011-03-21 DIAGNOSIS — D649 Anemia, unspecified: Secondary | ICD-10-CM

## 2011-03-21 DIAGNOSIS — I10 Essential (primary) hypertension: Secondary | ICD-10-CM | POA: Diagnosis present

## 2011-03-21 DIAGNOSIS — K922 Gastrointestinal hemorrhage, unspecified: Secondary | ICD-10-CM

## 2011-03-21 DIAGNOSIS — D6489 Other specified anemias: Principal | ICD-10-CM | POA: Diagnosis present

## 2011-03-21 DIAGNOSIS — C689 Malignant neoplasm of urinary organ, unspecified: Secondary | ICD-10-CM | POA: Diagnosis present

## 2011-03-21 HISTORY — DX: Headache: R51

## 2011-03-21 HISTORY — DX: Malignant (primary) neoplasm, unspecified: C80.1

## 2011-03-21 HISTORY — DX: Essential (primary) hypertension: I10

## 2011-03-21 HISTORY — DX: Depression, unspecified: F32.A

## 2011-03-21 HISTORY — DX: Reserved for inherently not codable concepts without codable children: IMO0001

## 2011-03-21 HISTORY — DX: Gastro-esophageal reflux disease without esophagitis: K21.9

## 2011-03-21 HISTORY — DX: Disorder of kidney and ureter, unspecified: N28.9

## 2011-03-21 HISTORY — DX: Malignant neoplasm of urinary organ, unspecified: C68.9

## 2011-03-21 HISTORY — DX: Anemia, unspecified: D64.9

## 2011-03-21 HISTORY — DX: Encounter for other specified aftercare: Z51.89

## 2011-03-21 HISTORY — DX: Major depressive disorder, single episode, unspecified: F32.9

## 2011-03-21 HISTORY — DX: Unspecified osteoarthritis, unspecified site: M19.90

## 2011-03-21 LAB — COMPREHENSIVE METABOLIC PANEL
ALT: 14 U/L (ref 0–53)
AST: 14 U/L (ref 0–37)
Albumin: 3.9 g/dL (ref 3.5–5.2)
Alkaline Phosphatase: 54 U/L (ref 39–117)
BUN: 25 mg/dL — ABNORMAL HIGH (ref 6–23)
CO2: 24 mEq/L (ref 19–32)
Calcium: 8.7 mg/dL (ref 8.4–10.5)
Chloride: 102 mEq/L (ref 96–112)
Creatinine, Ser: 1.77 mg/dL — ABNORMAL HIGH (ref 0.50–1.35)
GFR calc Af Amer: 46 mL/min — ABNORMAL LOW (ref >90)
GFR calc non Af Amer: 39 mL/min — ABNORMAL LOW (ref >90)
Glucose, Bld: 93 mg/dL (ref 70–99)
Potassium: 3.7 mEq/L (ref 3.5–5.1)
Sodium: 138 mEq/L (ref 135–145)
Total Bilirubin: 0.1 mg/dL — ABNORMAL LOW (ref 0.3–1.2)
Total Protein: 6.6 g/dL (ref 6.0–8.3)

## 2011-03-21 LAB — CBC
HCT: 18.6 % — ABNORMAL LOW (ref 39.0–52.0)
Hemoglobin: 6.6 g/dL — CL (ref 13.0–17.0)
MCH: 33.2 pg (ref 26.0–34.0)
MCHC: 35.5 g/dL (ref 30.0–36.0)
MCV: 93.5 fL (ref 78.0–100.0)
Platelets: 258 10*3/uL (ref 150–400)
RBC: 1.99 MIL/uL — ABNORMAL LOW (ref 4.22–5.81)
RDW: 17.7 % — ABNORMAL HIGH (ref 11.5–15.5)
WBC: 5.8 10*3/uL (ref 4.0–10.5)

## 2011-03-21 LAB — RETICULOCYTES
RBC.: 1.96 MIL/uL — ABNORMAL LOW (ref 4.22–5.81)
Retic Count, Absolute: 296 10*3/uL — ABNORMAL HIGH (ref 19.0–186.0)
Retic Ct Pct: 15.1 % — ABNORMAL HIGH (ref 0.4–3.1)

## 2011-03-21 LAB — URINALYSIS, ROUTINE W REFLEX MICROSCOPIC
Bilirubin Urine: NEGATIVE
Glucose, UA: NEGATIVE mg/dL
Hgb urine dipstick: NEGATIVE
Ketones, ur: NEGATIVE mg/dL
Leukocytes, UA: NEGATIVE
Nitrite: NEGATIVE
Protein, ur: NEGATIVE mg/dL
Specific Gravity, Urine: 1.026 (ref 1.005–1.030)
Urobilinogen, UA: 0.2 mg/dL (ref 0.0–1.0)
pH: 6 (ref 5.0–8.0)

## 2011-03-21 LAB — DIFFERENTIAL
Basophils Absolute: 0.1 10*3/uL (ref 0.0–0.1)
Basophils Relative: 1 % (ref 0–1)
Eosinophils Absolute: 0.1 10*3/uL (ref 0.0–0.7)
Eosinophils Relative: 1 % (ref 0–5)
Lymphocytes Relative: 26 % (ref 12–46)
Lymphs Abs: 1.5 10*3/uL (ref 0.7–4.0)
Monocytes Absolute: 0.4 10*3/uL (ref 0.1–1.0)
Monocytes Relative: 7 % (ref 3–12)
Neutro Abs: 3.7 10*3/uL (ref 1.7–7.7)
Neutrophils Relative %: 65 % (ref 43–77)

## 2011-03-21 LAB — PROTIME-INR
INR: 0.94 (ref 0.00–1.49)
Prothrombin Time: 12.8 seconds (ref 11.6–15.2)

## 2011-03-21 LAB — TECHNOLOGIST SMEAR REVIEW

## 2011-03-21 LAB — PREPARE RBC (CROSSMATCH)

## 2011-03-21 LAB — LACTATE DEHYDROGENASE: LDH: 172 U/L (ref 94–250)

## 2011-03-21 LAB — GLUCOSE, CAPILLARY: Glucose-Capillary: 94 mg/dL (ref 70–99)

## 2011-03-21 LAB — APTT: aPTT: 30 seconds (ref 24–37)

## 2011-03-21 MED ORDER — ACETAMINOPHEN 325 MG PO TABS
650.0000 mg | ORAL_TABLET | Freq: Four times a day (QID) | ORAL | Status: DC | PRN
  Administered 2011-03-22 (×2): 650 mg via ORAL
  Filled 2011-03-21 (×2): qty 2

## 2011-03-21 MED ORDER — SIMVASTATIN 40 MG PO TABS
40.0000 mg | ORAL_TABLET | Freq: Every day | ORAL | Status: DC
  Administered 2011-03-22 – 2011-03-24 (×3): 40 mg via ORAL
  Filled 2011-03-21 (×4): qty 1

## 2011-03-21 MED ORDER — ONDANSETRON HCL 4 MG/2ML IJ SOLN: 4.0000 mg | Freq: Four times a day (QID) | INTRAMUSCULAR | Status: DC | PRN

## 2011-03-21 MED ORDER — SODIUM CHLORIDE 0.9 % IV SOLN
80.0000 mg | Freq: Once | INTRAVENOUS | Status: AC
  Administered 2011-03-21: 80 mg via INTRAVENOUS
  Filled 2011-03-21: qty 80

## 2011-03-21 MED ORDER — ZOLPIDEM TARTRATE 5 MG PO TABS
5.0000 mg | ORAL_TABLET | Freq: Every evening | ORAL | Status: DC | PRN
  Administered 2011-03-21 – 2011-03-24 (×4): 5 mg via ORAL
  Filled 2011-03-21 (×4): qty 1

## 2011-03-21 MED ORDER — SODIUM CHLORIDE 0.9 % IJ SOLN
3.0000 mL | Freq: Two times a day (BID) | INTRAMUSCULAR | Status: DC
  Administered 2011-03-22 – 2011-03-25 (×6): 3 mL via INTRAVENOUS

## 2011-03-21 MED ORDER — SODIUM CHLORIDE 0.9 % IV SOLN
8.0000 mg/h | INTRAVENOUS | Status: DC
  Administered 2011-03-21: 8 mg/h via INTRAVENOUS
  Filled 2011-03-21 (×3): qty 80

## 2011-03-21 MED ORDER — ONDANSETRON HCL 4 MG PO TABS: 4.0000 mg | ORAL_TABLET | Freq: Four times a day (QID) | ORAL | Status: DC | PRN

## 2011-03-21 MED ORDER — ACETAMINOPHEN 650 MG RE SUPP: 650.0000 mg | Freq: Four times a day (QID) | RECTAL | Status: DC | PRN

## 2011-03-21 MED ORDER — SODIUM CHLORIDE 0.9 % IV SOLN
INTRAVENOUS | Status: DC
  Administered 2011-03-21: 21:00:00 via INTRAVENOUS

## 2011-03-21 MED ORDER — SERTRALINE HCL 100 MG PO TABS
100.0000 mg | ORAL_TABLET | Freq: Every day | ORAL | Status: DC
  Administered 2011-03-22 – 2011-03-25 (×4): 100 mg via ORAL
  Filled 2011-03-21 (×4): qty 1

## 2011-03-21 NOTE — ED Notes (Signed)
MD ok'd patient something to eat gave him a sand. Coke and he was satisfied.

## 2011-03-21 NOTE — ED Notes (Signed)
Hospitalist Dr Angus Palms wants lab draws prior to transfusion and will write the orders

## 2011-03-21 NOTE — ED Notes (Signed)
[employer] MD of pts hgb being 6.6he stated he already knew.  Pt remains alert and orineted no acute distress noted.

## 2011-03-21 NOTE — ED Notes (Signed)
Res C/o weakness and some abd pain His HGb just came and it is 6.6 which is critical value at 1938 We will let the primary MD know of this result.

## 2011-03-21 NOTE — ED Provider Notes (Signed)
History     CSN: [id number]  Arrival date & time 03/21/11  1738   First MD Initiated Contact with Patient 03/21/11 1834      Chief Complaint  Patient presents with  . Weakness    (Consider location/radiation/quality/duration/timing/severity/associated sxs/prior treatment) HPI Comments: History of anemia. Increasing generalized weakness. His had melanotic stool. Seen by his primary care physician and found to have a hemoglobin of 6  Patient is a 61 y.o. male presenting with weakness. The history is provided by the patient and the spouse. No language interpreter was used.  Weakness Primary symptoms do not include headaches, syncope, loss of consciousness, altered mental status, seizures, dizziness, visual change, paresthesias, focal weakness, loss of sensation, fever, nausea or vomiting. The symptoms began more than 1 week ago. The symptoms are worsening. The neurological symptoms are diffuse. The symptoms occurred on exertion.  Additional symptoms do not include neck stiffness, weakness, pain, lower back pain, leg pain or loss of balance.    Past Medical History  Diagnosis Date  . Cancer   . Renal disorder   . Hypertension     History reviewed. No pertinent past surgical history.  No family history on file.  History  Substance Use Topics  . Smoking status: Never Smoker   . Smokeless tobacco: Not on file  . Alcohol Use: Yes      Review of Systems  Constitutional: Positive for fatigue. Negative for fever, chills, activity change and appetite change.  HENT: Negative for congestion, sore throat, rhinorrhea, neck pain and neck stiffness.   Respiratory: Positive for shortness of breath. Negative for cough.   Cardiovascular: Negative for chest pain, palpitations and syncope.  Gastrointestinal: Positive for blood in stool. Negative for nausea, vomiting, abdominal pain, diarrhea and constipation.  Genitourinary: Negative for dysuria, urgency, frequency and flank pain.    Musculoskeletal: Negative for myalgias, back pain and arthralgias.  Neurological: Positive for light-headedness. Negative for dizziness, focal weakness, seizures, loss of consciousness, weakness, numbness, headaches, paresthesias and loss of balance.  Psychiatric/Behavioral: Negative for altered mental status.  All other systems reviewed and are negative.    Allergies  Review of patient's allergies indicates no known allergies.  Home Medications   Current Outpatient Rx  Name Route Sig Dispense Refill  . ASPIRIN EC 81 MG PO TBEC Oral Take 81 mg by mouth daily.    [name] FERROUS SULFATE 325 (65 FE) MG PO TABS Oral Take 325 mg by mouth 2 (two) times daily.    [name] FOLIC ACID 800 MCG PO TABS Oral Take 800 mcg by mouth daily.    [name] HYDROCODONE-ACETAMINOPHEN 5-500 MG PO TABS Oral Take 1 tablet by mouth every 8 (eight) hours as needed. For pain    . SERTRALINE HCL 100 MG PO TABS Oral Take 100 mg by mouth daily.    [name] SIMVASTATIN 40 MG PO TABS Oral Take 40 mg by mouth every evening.    [name] TRAMADOL HCL 50 MG PO TABS Oral Take 50 mg by mouth every 8 (eight) hours as needed. For pain    . VALSARTAN-HYDROCHLOROTHIAZIDE 160-25 MG PO TABS Oral Take 1 tablet by mouth daily.    [name] ZOLPIDEM TARTRATE 5 MG PO TABS Oral Take 5 mg by mouth at bedtime as needed. For insomnia      BP 141/67  Pulse 77  Temp(Src) 97.5 F (36.4 C) (Oral)  Resp 15  Ht 5\' 10"  (1.778 m)  Wt 190 lb (86.183 kg)  BMI 27.26 kg/m2  SpO2 99%  Physical Exam  Nursing note and vitals reviewed. Constitutional: He is oriented to person, place, and time. He appears well-developed and well-nourished. No distress.  HENT:  Head: Normocephalic and atraumatic.  Mouth/Throat: Oropharynx is clear and moist.  Eyes: Conjunctivae and EOM are normal. Pupils are equal, round, and reactive to light.  Neck: Normal range of motion. Neck supple.  Cardiovascular: Normal rate, regular rhythm and intact distal pulses.  Exam reveals no gallop and no friction  rub.   No murmur heard. Pulmonary/Chest: Effort normal and breath sounds normal. No respiratory distress.  Abdominal: Soft. Bowel sounds are normal. There is no tenderness.  Genitourinary: Guaiac positive stool.  Musculoskeletal: Normal range of motion. He exhibits no tenderness.  Neurological: He is alert and oriented to person, place, and time. No cranial nerve deficit.  Skin: Skin is warm and dry. There is pallor.    ED Course  Procedures (including critical care time)  CRITICAL CARE Performed by: Dayton Bailiff   Total critical care time: 30 min  Critical care time was exclusive of separately billable procedures and treating other patients.  Critical care was necessary to treat or prevent imminent or life-threatening deterioration.  Critical care was time spent personally by me on the following activities: development of treatment plan with patient and/or surrogate as well as nursing, discussions with consultants, evaluation of patient's response to treatment, examination of patient, obtaining history from patient or surrogate, ordering and performing treatments and interventions, ordering and review of laboratory studies, ordering and review of radiographic studies, pulse oximetry and re-evaluation of patient's condition.   Date: 03/21/2011  Rate: 75  Rhythm: normal sinus rhythm  QRS Axis: normal  Intervals: normal  ST/T Wave abnormalities: normal  Conduction Disutrbances:none  Narrative Interpretation:   Old EKG Reviewed: unchanged  Labs Reviewed  CBC - Abnormal; Notable for the following:    RBC 1.99 (*)    Hemoglobin 6.6 (*)    HCT 18.6 (*)    RDW 17.7 (*)    All other components within normal limits  COMPREHENSIVE METABOLIC PANEL - Abnormal; Notable for the following:    BUN 25 (*)    Creatinine, Ser 1.77 (*)    Total Bilirubin 0.1 (*)    GFR calc non Af Amer 39 (*)    GFR calc Af Amer 46 (*)    All other components within normal limits  DIFFERENTIAL    URINALYSIS, ROUTINE W REFLEX MICROSCOPIC  APTT  PROTIME-INR  TYPE AND SCREEN  PREPARE RBC (CROSSMATCH)  GLUCOSE, CAPILLARY  RED CROSS HLA ABC TYPING   Dg Chest 2 View  03/21/2011  *RADIOLOGY REPORT*  Clinical Data: Shortness of breath.  Weakness.  Dizziness.  CHEST - 2 VIEW  Comparison: 09/09/2010  Findings: The heart size and vascularity are normal and the lungs are clear.  For a tiny stable granuloma in the left midzone.  No osseous abnormality.  IMPRESSION: No significant abnormalities.  Original Report Authenticated By: Gwynn Burly, M.D.     1. Anemia   2. GI bleed       MDM  Patient with anemia likely secondary to a GI bleed as he was guaiac-positive. This protonic strip. 2 units to be transfused. He'll be admitted for further evaluation and treatment. Has remained medically stable while in the emergency department.        Dayton Bailiff, MD 03/21/11 2050

## 2011-03-21 NOTE — H&P (Signed)
PCP:   Gaspar Skeeters, MD, MD  Confirmed with pt. This PCP is in [facility] Dr. 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   Pt states he was admitted to hospital in [place] for PNA just before Christmas 01/2011,  and they noted he was anemic for which he was given a blood transfusion and discharged on iron  pills, but they do not know what the formal diagnosis was (presumably IDA?). He states the PNA  symptoms are completely resolved at present. They were set up with a Dr. Greggory Stallion, nephrology in  [place], to f/u, and did see him beginning of January, but does not know what his Cr was.  He states he has been taking the iron pills which have made his stools pasty and black.   Starting 1.5-2wk ago started feeling more weak, lightheaded, dizzy orthostatic in nature, worse  with bending over or picking up from the ground, associated with feeling "freezing cold" and  malaised. No LOC, syncope. No overt blood loss anywhere (no hematemesis, epistaxis, BRBPR), but  they have noted dark, black stools of paste consistency for which he stopped iron (also getting  constipated) because they thought this was the cause. No fevers or sweats, but with subjective  chills. No GERD or PUD type pain. Does not take any NSAID's or drink excessive alcohol.  Previous to this, he was active and able to walk daily. Pt has had at least 2 colonoscopies  with 2 small polyps each time, no colon ca, no known diverticulosis. Last was 2.5 yrs ago.   He notes that many people around him noted increased paleness. PCP Dr. Truddie Crumble drew a CBC  today which showed low Hgb and pt was referred to ED.   In the ED vitals were stable. Labs with renal 25/1.77, normal LFT's. CBC with Hct 18.6 with MCV  93,  normal plts. INR 0.94. UA negative, no Hgb in urine. CXR negative. Fecal occult blood  positive but no blood on rectal exam. Admission requested for acute anemia.   ROS as above, otherwise with minor headache, SOB, but chest pain, vision problems, other  cardiopulmonary problems, no GI problems of n/v/abd pain/diarrhea. Pt does not drink EtOH every  day, and none since feeling badly. Pt take low dose ASA daily, and occasional ASA for  headaches.   Past Medical History  Diagnosis Date  . 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 Renal disorder   . Hypertension    History reviewed. No pertinent past surgical history.  Medications:  HOME MEDS: Reconciled with pt  Prior to Admission medications   Medication Sig Start Date End Date Taking? Authorizing Provider  aspirin EC 81 MG tablet Take 81 mg by mouth daily.   Yes Historical Provider, MD  ferrous sulfate 325 (65 FE) MG tablet Take 325 mg by mouth 2 (two) times daily.   Yes Historical Provider, MD  folic acid (FOLVITE) 800 MCG tablet Take 800 mcg by mouth daily.   Yes Historical Provider, MD  HYDROcodone-acetaminophen (VICODIN) 5-500 MG per tablet Take 1 tablet by mouth every 8 (eight) hours as needed. For pain   Yes Historical Provider, MD  sertraline (ZOLOFT) 100 MG tablet Take 100 mg by mouth daily.   Yes Historical Provider, MD  simvastatin (  ZOCOR) 40 MG tablet Take 40 mg by mouth every evening.   Yes Historical Provider, MD  traMADol (ULTRAM) 50 MG tablet Take 50 mg by mouth every 8 (eight) hours as needed. For pain   Yes Historical Provider, MD  valsartan-hydrochlorothiazide (DIOVAN-HCT) 160-25 MG per tablet Take 1 tablet by mouth daily.   Yes Historical Provider, MD  zolpidem (AMBIEN) 5 MG tablet Take 5 mg by mouth at bedtime as needed. For insomnia   Yes Historical Provider, MD   Allergies:  Allergies  Allergen Reactions  . Sulfa Antibiotics     Headaches  .  Tetracyclines & Related     Nausea    Social History:   reports that he quit smoking about 13 years ago. His smoking use included Cigarettes. He has a 25 pack-year smoking history. He has never used smokeless tobacco. He reports that he drinks alcohol. He reports that he does not use illicit drugs. 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 Have two children, and 2 grandchildren. No cane or walker. [name] = wife.   Family History: No family history on file.  Physical Exam: Filed Vitals:   03/21/11 1753 03/21/11 1925 03/21/11 1957 03/21/11 2221  BP:  141/69 141/67 134/76  Pulse:  78 77 77  Temp:   97.5 F (36.4 C)   TempSrc:   Oral   Resp:  15  20  Height: 5\' 10"  (1.778 m)     Weight: 86.183 kg (190 lb)     SpO2:  99%  96%   Blood pressure 134/76, pulse 77, temperature 97.5 F (36.4 C), temperature source Oral, resp. rate 20, height 5\' 10"  (1.778 m), weight 86.183 kg (190 lb), SpO2 96.00%.  Gen: Middle aged, overall healthy for age appearing M in no distress, able to relate history  well, can speak full sentences, wife at bedside. He does however appear grossly pale in his  face and eyes.  HEENT: Pupils round, reactive, with pale conjunctivae but irises and sclera normal, no  jaundice. Mouth moist, normal Lungs: CTAB no w/c/r, normal exam overall good air movement Heart: Regular, not tachycardic, with a holosystolic murmur at left midclavicular line not  heard elsewhere. No gallops Abd: Very benign, soft, NT ND, no facial grimacing, not distended Extrem: Pale fingertips and fingers, but overall normal with good bulk and tone, easily  palpable radials, no BLE edema. Cool but not cold.  Neuro: Alert, attentive, conversant, pleasant, moves extremities on his own. Grossly non-focal.   Labs & Imaging Results for orders placed during the hospital encounter of 03/21/11 (from the past 48 hour(s))  CBC      Status: Abnormal   Collection Time   03/21/11  6:55 PM      Component Value Range Comment   WBC 5.8  4.0 - 10.5 (K/uL)    RBC 1.99 (*) 4.22 - 5.81 (MIL/uL)    Hemoglobin 6.6 (*) 13.0 - 17.0 (g/dL)    HCT 16.1 (*) 09.6 - 52.0 (%)    MCV 93.5  78.0 - 100.0 (fL)    MCH 33.2  26.0 - 34.0 (pg)    MCHC 35.5  30.0 - 36.0 (g/dL)    RDW 04.5 (*) 40.9 - 15.5 (%)    Platelets 258  150 - 400 (K/uL)   DIFFERENTIAL     Status: Normal   Collection Time   03/21/11  6:55 PM      Component Value Range Comment  Neutrophils Relative 65  43 - 77 (%)    Lymphocytes Relative 26  12 - 46 (%)    Monocytes Relative 7  3 - 12 (%)    Eosinophils Relative 1  0 - 5 (%)    Basophils Relative 1  0 - 1 (%)    Neutro Abs 3.7  1.7 - 7.7 (K/uL)    Lymphs Abs 1.5  0.7 - 4.0 (K/uL)    Monocytes Absolute 0.4  0.1 - 1.0 (K/uL)    Eosinophils Absolute 0.1  0.0 - 0.7 (K/uL)    Basophils Absolute 0.1  0.0 - 0.1 (K/uL)    RBC Morphology BASOPHILIC STIPPLING     COMPREHENSIVE METABOLIC PANEL     Status: Abnormal   Collection Time   03/21/11  6:55 PM      Component Value Range Comment   Sodium 138  135 - 145 (mEq/L)    Potassium 3.7  3.5 - 5.1 (mEq/L)    Chloride 102  96 - 112 (mEq/L)    CO2 24  19 - 32 (mEq/L)    Glucose, Bld 93  70 - 99 (mg/dL)    BUN 25 (*) 6 - 23 (mg/dL)    Creatinine, Ser 1.61 (*) 0.50 - 1.35 (mg/dL)    Calcium 8.7  8.4 - 10.5 (mg/dL)    Total Protein 6.6  6.0 - 8.3 (g/dL)    Albumin 3.9  3.5 - 5.2 (g/dL)    AST 14  0 - 37 (U/L)    ALT 14  0 - 53 (U/L)    Alkaline Phosphatase 54  39 - 117 (U/L)    Total Bilirubin 0.1 (*) 0.3 - 1.2 (mg/dL)    GFR calc non Af Amer 39 (*) >90 (mL/min)    GFR calc Af Amer 46 (*) >90 (mL/min)   APTT     Status: Normal   Collection Time   03/21/11  6:55 PM      Component Value Range Comment   aPTT 30  24 - 37 (seconds)   PROTIME-INR     Status: Normal   Collection Time   03/21/11  6:55 PM      Component Value Range Comment   Prothrombin Time 12.8  11.6 - 15.2  (seconds)    INR 0.94  0.00 - 1.49    TYPE AND SCREEN     Status: Normal (Preliminary result)   Collection Time   03/21/11  6:55 PM      Component Value Range Comment   ABO/RH(D) O POS      Antibody Screen NEG      Sample Expiration 03/24/2011      Unit Number [id number]      Blood Component Type RED CELLS,LR      Unit division 00      Status of Unit ALLOCATED      Transfusion Status OK TO TRANSFUSE      Crossmatch Result Compatible      Unit Number [id number]      Blood Component Type RED CELLS,LR      Unit division 00      Status of Unit ALLOCATED      Transfusion Status OK TO TRANSFUSE      Crossmatch Result Compatible     PREPARE RBC (CROSSMATCH)     Status: Normal   Collection Time   03/21/11  6:55 PM      Component Value Range Comment   Order Confirmation ORDER PROCESSED BY BLOOD BANK  URINALYSIS, ROUTINE W REFLEX MICROSCOPIC     Status: Normal   Collection Time   03/21/11  7:25 PM      Component Value Range Comment   Color, Urine YELLOW  YELLOW     APPearance CLEAR  CLEAR     Specific Gravity, Urine 1.026  1.005 - 1.030     pH 6.0  5.0 - 8.0     Glucose, UA NEGATIVE  NEGATIVE (mg/dL)    Hgb urine dipstick NEGATIVE  NEGATIVE     Bilirubin Urine NEGATIVE  NEGATIVE     Ketones, ur NEGATIVE  NEGATIVE (mg/dL)    Protein, ur NEGATIVE  NEGATIVE (mg/dL)    Urobilinogen, UA 0.2  0.0 - 1.0 (mg/dL)    Nitrite NEGATIVE  NEGATIVE     Leukocytes, UA NEGATIVE  NEGATIVE  MICROSCOPIC NOT DONE ON URINES WITH NEGATIVE PROTEIN, BLOOD, LEUKOCYTES, NITRITE, OR GLUCOSE <1000 mg/dL.  GLUCOSE, CAPILLARY     Status: Normal   Collection Time   03/21/11  7:38 PM      Component Value Range Comment   Glucose-Capillary 94  70 - 99 (mg/dL)    Comment 1 Notify RN      Comment 2 Documented in Chart      Dg Chest 2 View  03/21/2011  *RADIOLOGY REPORT*  Clinical Data: Shortness of breath.  Weakness.  Dizziness.  CHEST - 2 VIEW  Comparison: 09/09/2010  Findings: The heart size and vascularity are normal  and the lungs are clear.  For a tiny stable granuloma in the left midzone.  No osseous abnormality.  IMPRESSION: No significant abnormalities.  Original Report Authenticated By: Gwynn Burly, M.D.   ECG: NSR 75 bpm, normal axis, early RWP in V2, no ST segment deviation. 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   1. Acute anemia: Hct 18.6 with MCV 93, basophilic stippling noted on smear (DDx megaloblastic  anemia, Hgb abnormalities, or sideroblastic). Last Hct's were 27-34 in 10/2010. Fecal occult  blood noted to be positive in the ED, however pt has been taking iron since last d/c in  01/2011, which can cause black stools and also heme occult positiver per rectum.   Overall, I am not that impressed with GI source -- there is no BRBPR, no GI symptoms at all,  and the black stools can be explained by iron. Rather, I suspect this has to do with his recent  nephrectomy and that he is underproducing, rather than losing blood. We'll start with anemia  panel followed by transfusion, and depending on the results of anemia panel consider heme vs  renal consultation.   - Anemia panel with periperhal smear, LDH. Stop Pantoprazole, doubt this is from upper GI  source/PUD. Heme occult all stools.  - Holding ASA 81 mg, iron, folate for now  - Orthostatics on admission  2. Acute renal insufficiency: Cr 1.77 up from last value 1.48 in 10/2010. However, looks like Cr  baseline is anywhere from 1.0 to 1.5 over the past 6 months or so. Either pt is at new baseline  of CKD vs pt in ARF with DDx including being s/p nephrectomy vs pre-renal from anemia vs  medication effect of Valsartan/HCTZ and only having one kidney  - Holding Valsart/HCTZ for now.  Transfuse blood, trend BMET.  Consider touching base with outpt nephrologist for last Cr   3. Psych: Continue home zoloft  4. HL: continue home statin  Telemetry, [facility] team 1  Full code, discussed   Other plans as per orders. Vladislav Axelson 03/21/2011, 10:47 PM

## 2011-03-21 NOTE — ED Notes (Signed)
Pt to floor via float nurse in stable condition wife is with him

## 2011-03-21 NOTE — ED Notes (Signed)
Pt reports sent by MD for HGB 6, states felt weak x 1.5 weeks. Pt pale on assessment. Reports dizziness/lightheaded x 1 week. Generalized body aches. Black stools x 1 month. Last couple of days describe as black, dark, soot like.

## 2011-03-21 NOTE — Progress Notes (Signed)
03/21/2011... .2207... Called ED to s/w [facility] for report... Put on hold for extended time; will try again or await return call... Marvia Pickles, RN

## 2011-03-22 DIAGNOSIS — D649 Anemia, unspecified: Secondary | ICD-10-CM

## 2011-03-22 DIAGNOSIS — R195 Other fecal abnormalities: Secondary | ICD-10-CM

## 2011-03-22 LAB — BASIC METABOLIC PANEL
BUN: 21 mg/dL (ref 6–23)
CO2: 26 mEq/L (ref 19–32)
Calcium: 8.6 mg/dL (ref 8.4–10.5)
Chloride: 104 mEq/L (ref 96–112)
Creatinine, Ser: 1.69 mg/dL — ABNORMAL HIGH (ref 0.50–1.35)
GFR calc Af Amer: 48 mL/min — ABNORMAL LOW (ref >90)
GFR calc non Af Amer: 42 mL/min — ABNORMAL LOW (ref >90)
Glucose, Bld: 116 mg/dL — ABNORMAL HIGH (ref 70–99)
Potassium: 4 mEq/L (ref 3.5–5.1)
Sodium: 140 mEq/L (ref 135–145)

## 2011-03-22 LAB — OCCULT BLOOD X 1 CARD TO LAB, STOOL: Fecal Occult Bld: NEGATIVE

## 2011-03-22 LAB — CBC
HCT: 23.4 % — ABNORMAL LOW (ref 39.0–52.0)
Hemoglobin: 8.1 g/dL — ABNORMAL LOW (ref 13.0–17.0)
MCH: 30.8 pg (ref 26.0–34.0)
MCHC: 34.6 g/dL (ref 30.0–36.0)
MCV: 89 fL (ref 78.0–100.0)
Platelets: 193 10*3/uL (ref 150–400)
RBC: 2.63 MIL/uL — ABNORMAL LOW (ref 4.22–5.81)
RDW: 18.4 % — ABNORMAL HIGH (ref 11.5–15.5)
WBC: 4.3 10*3/uL (ref 4.0–10.5)

## 2011-03-22 LAB — FOLATE: Folate: >20 ng/mL

## 2011-03-22 LAB — IRON AND TIBC
Iron: 238 ug/dL — ABNORMAL HIGH (ref 42–135)
Saturation Ratios: 72 % — ABNORMAL HIGH (ref 20–55)
TIBC: 330 ug/dL (ref 215–435)
UIBC: 92 ug/dL — ABNORMAL LOW (ref 125–400)

## 2011-03-22 LAB — VITAMIN B12: Vitamin B-12: 546 pg/mL (ref 211–911)

## 2011-03-22 LAB — PREPARE RBC (CROSSMATCH)

## 2011-03-22 LAB — FERRITIN: Ferritin: 98 ng/mL (ref 22–322)

## 2011-03-22 MED ORDER — ALPRAZOLAM 0.25 MG PO TABS
0.2500 mg | ORAL_TABLET | Freq: Once | ORAL | Status: AC
  Administered 2011-03-22: 0.25 mg via ORAL
  Filled 2011-03-22: qty 1

## 2011-03-22 MED ORDER — LORAZEPAM 0.5 MG PO TABS
0.5000 mg | ORAL_TABLET | Freq: Two times a day (BID) | ORAL | Status: DC | PRN
  Administered 2011-03-22 – 2011-03-25 (×4): 0.5 mg via ORAL
  Filled 2011-03-22 (×4): qty 1

## 2011-03-22 NOTE — Consult Note (Signed)
[facility] Consultation  Referring Provider: [facility] Primary Care Physician:  Gaspar Skeeters, MD, MD Primary Gastroenterologist:   Dr. Allyson Sabal in [place], [place]. He is now retired.  Reason for Consultation:  Anemia, black stools (iron)  HPI: George Jacobson is a 61 y.o. 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  He had multiple endoscopic ablations but tumor was not amenable to therapy. He finally underwent right nephroureterectomy in September 2012.   Patient was hospitalized for PNA in [place] around Christmas time.  During that admission patient was found to be anemic, transfused blood and started on oral iron. Since then his stools have been black on iron. Patient takes a baby asa but no other NSAIDS. His weight has been stable.  No GI complaints such as nausea or abdominal pain. He has mild constipation on iron. Over the last ten days patient became increasingly weak. He went to see PCP where labs revealed profound anemia. Patient was advised by PCP to go to ED  Patient has had at least 2 colonoscopies with polypectomies in [facility] by Dr. Allyson Sabal who is now retired. His last colonoscopy was 2.5 years ago.   Past Medical History  Diagnosis Date  . 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 Renal disorder   . Hypertension     PSH: nephroureterectomy in September 2012.  FMH:  No colon cancer  Prior to Admission medications   Medication Sig Start Date End Date Taking? Authorizing Provider  aspirin EC 81 MG tablet Take 81 mg by mouth daily.   Yes Historical Provider, MD  ferrous sulfate 325 (65 FE) MG tablet Take 325 mg by mouth 2 (two) times daily.   Yes Historical Provider, MD  folic acid (FOLVITE) 800 MCG tablet Take 800 mcg by mouth daily.   Yes Historical Provider, MD  HYDROcodone-acetaminophen (VICODIN) 5-500 MG per tablet Take 1  tablet by mouth every 8 (eight) hours as needed. For pain   Yes Historical Provider, MD  sertraline (ZOLOFT) 100 MG tablet Take 100 mg by mouth daily.   Yes Historical Provider, MD  simvastatin (ZOCOR) 40 MG tablet Take 40 mg by mouth every evening.   Yes Historical Provider, MD  traMADol (ULTRAM) 50 MG tablet Take 50 mg by mouth every 8 (eight) hours as needed. For pain   Yes Historical Provider, MD  valsartan-hydrochlorothiazide (DIOVAN-HCT) 160-25 MG per tablet Take 1 tablet by mouth daily.   Yes Historical Provider, MD  zolpidem (AMBIEN) 5 MG tablet Take 5 mg by mouth at bedtime as needed. For insomnia   Yes Historical Provider, MD    Current Facility-Administered Medications  Medication Dose Route Frequency Provider Last Rate Last Dose  . acetaminophen (TYLENOL) tablet 650 mg  650 mg Oral Q6H PRN Carlota Raspberry, MD   650 mg at 03/22/11 7829   Or  . acetaminophen (TYLENOL) suppository 650 mg  650 mg Rectal Q6H PRN Carlota Raspberry, MD      . ALPRAZolam Prudy Feeler) tablet 0.25 mg  0.25 mg Oral Once Richarda Overlie, MD   0.25 mg at 03/22/11 0420  . ondansetron (ZOFRAN) tablet 4 mg  4 mg Oral Q6H PRN Carlota Raspberry, MD       Or  . ondansetron [facility]) injection 4 mg  4 mg Intravenous Q6H PRN Carlota Raspberry, MD      . pantoprazole (PROTONIX) 80 mg in sodium chloride 0.9 % 100 mL IVPB  80 mg Intravenous Once Dayton Bailiff, MD   80 mg at 03/21/11 1951  . sertraline (ZOLOFT) tablet 100 mg  100 mg Oral Daily Carlota Raspberry, MD   100 mg at 03/22/11 0942  . simvastatin (ZOCOR) tablet 40 mg  40 mg Oral QPC supper Carlota Raspberry, MD      . sodium chloride 0.9 % injection 3 mL  3 mL Intravenous Q12H Carlota Raspberry, MD   3 mL at 03/22/11 1159  . zolpidem (AMBIEN) tablet 5 mg  5 mg Oral QHS PRN Carlota Raspberry, MD   5 mg at 03/21/11 2355  . DISCONTD: 0.9 %  sodium chloride infusion   Intravenous Continuous Dayton Bailiff, MD 125 mL/hr at 03/21/11 2108    . DISCONTD: pantoprazole (PROTONIX) 80 mg in sodium chloride 0.9 % 250 mL infusion  8 mg/hr  Intravenous Continuous Dayton Bailiff, MD 25 mL/hr at 03/21/11 2019 8 mg/hr at 03/21/11 2019    Allergies as of 03/21/2011 - Review Complete 03/21/2011  Allergen Reaction Noted  . Sulfa antibiotics  03/21/2011  . Tetracyclines & related  03/21/2011    History   Social History  . Marital Status: Married    Spouse Name: N/A    Number of Children: N/A  . Years of Education: N/A   Occupational History  . Not on file.   Social History Main Topics  . Smoking status: Former Smoker -- 1.0 packs/day for 25 years    Types: Cigarettes    Quit date: 02/14/1998  . Smokeless tobacco: Never Used  . Alcohol Use: Yes     2 nights a week, not much though  . Drug Use: No  . Sexually Active: Not on file   Other Topics Concern  . 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 Have two children, and 2 grandchildren. No cane or walker. [name] = wife.     Review of Systems: Positive for restlessness/shaking of upper and lower extremities over the last week. Positive for chronic back pain. All other systems reviewed and negative except where noted in HPI.   PHYSICAL EXAM: Vital signs in last 24 hours: Temp:  [97.3 F (36.3 C)-98.6 F (37 C)] 97.7 F (36.5 C) (02/05 1425) Pulse Rate:  [58-82] 82  (02/05 1425) Resp:  [15-20] 20  (02/05 1425) BP: (104-149)/(63-88) 143/69 mmHg (02/05 1425) SpO2:  [96 %-99 %] 97 % (02/05 1425) FiO2 (%):  [2 %] 2 % (02/05 1425) Weight:  [86.183 kg (190 lb)-88 kg (194 lb 0.1 oz)] 88 kg (194 lb 0.1 oz) (02/04 2253) Last BM Date: 03/22/11 General:   Pleasant white male in NAD Head:  Normocephalic and atraumatic. Eyes:   No icterus.   Conjunctiva pale. Ears:  Normal auditory acuity. Neck:  Supple; no masses felt Lungs:  Respirations even and unlabored. Lungs clear to auscultation bilaterally.   No wheezes, crackles, or rhonchi.  Heart:  Regular rate  and rhythm; no murmurs heard. Abdomen:  Soft, nondistended, nontender. Normal bowel sounds. No appreciable masses or hepatomegaly.  Rectal:  Very scant amount of dark (?greenish) heme positive stool   Msk:  Symmetrical without gross deformities.  Extremities:  Without edema.Intermittent shaking of bilateral upper extremities.  Neurologic:  Alert and  oriented x4;  grossly normal neurologically. Skin:  Intact without significant lesions or rashes. Cervical Nodes:  No significant cervical adenopathy. Psych:  Alert and cooperative. Normal affect.  LAB RESULTS:  Basename 03/22/11 0740 03/21/11 1855  WBC 4.3 5.8  HGB 8.1* 6.6*  HCT 23.4* 18.6*  PLT 193 258   BMET  Basename 03/22/11 0740 03/21/11 1855  NA 140 138  K 4.0 3.7  CL 104 102  CO2 26 24  GLUCOSE 116* 93  BUN 21 25*  CREATININE 1.69* 1.77*  CALCIUM 8.6 8.7   LFT  Basename 03/21/11 1855  PROT 6.6  ALBUMIN 3.9  AST 14  ALT 14  ALKPHOS 54  BILITOT 0.1*  BILIDIR --  IBILI --   PT/INR  Basename 03/21/11 1855  LABPROT 12.8  INR 0.94    03/22/2011 14:55  Ref. Range 03/21/2011 18:30  Iron Latest Range: 42-135 ug/dL 960 (H)  UIBC Latest Range: 125-400 ug/dL 92 (L)  TIBC Latest Range: 215-435 ug/dL 454  Saturation Ratios Latest Range: 20-55 % 72 (H)  Ferritin Latest Range: 22-322 ng/mL 98  Folate No range found >20.0    PREVIOUS ENDOSCOPIES: See HPI, done in [place] by Dr. Allyson Sabal who is now retired.   IMPRESSION / PLAN: 66. 61 year old white male with profound normocytic anemia, recurrent with first episode being just prior to Christmas. Stools black but heme positive on iron. No GI complaints except mild constipation on iron. Rule out PUD, intestinal AVMs, colon neoplasm less likely. Rule out small bowel neoplasm. For further evaluation will proceed with EGD in am. Further recommendations pending EGD results.  2. History of low-grade papillary urothelial carcinoma of the right renal pelvis, s/p right  nephroureterectomy in September 2012.  3. Chronic low back pain, takes Ultram and Hydrocodone.  4. Upper and lower extremity tremors, ?anxiety. Will try Ativan and defer workup (if necessary) to admitting team.   Thanks   LOS: 1 day   Willette Cluster  03/22/2011, 2:45 PM

## 2011-03-22 NOTE — Progress Notes (Signed)
03/21/11... 2300... Per John in Lab, new bloodwork orders can be added to blood previously drawn... Marvia Pickles, RN

## 2011-03-22 NOTE — Consult Note (Signed)
Patient seen, examined,  and I agree with the above documentation, including the assessment and plan. Severe anemia with heme positive stools Iron studies are not consistent with iron def, however, unclear if this was pre or post transfusion. Plan EGD tomorrow, if neg, consider VCE Obtain colonoscopy report

## 2011-03-22 NOTE — Progress Notes (Signed)
Patient ID: George Jacobson, male   DOB: 1950-02-17, 61 y.o.   MRN: [id number]  Subjective: No events overnight. Patient denies chest pain, shortness of breath, abdominal pain. Had bowel movement and reports ambulating. Pt reports black stool.  Objective:  Vital signs in last 24 hours:  Filed Vitals:   03/22/11 0613 03/22/11 0748 03/22/11 1425 03/22/11 1543  BP: 149/88 124/78 143/69 109/60  Pulse: 69 58 82 63  Temp: 97.6 F (36.4 C) 98.6 F (37 C) 97.7 F (36.5 C) 98.6 F (37 C)  TempSrc: Oral Oral Oral Oral  Resp: 18 18 20 20   Height:      Weight:      SpO2: 99% 99% 97% 100%    Intake/Output from previous day:   Intake/Output Summary (Last 24 hours) at 03/22/11 2001 Last data filed at 03/22/11 1300  Gross per 24 hour  Intake   1205 ml  Output   1225 ml  Net    -20 ml    Physical Exam: General: Alert, awake, oriented x3, in no acute distress. HEENT: No bruits, no goiter. Moist mucous membranes, no scleral icterus, no conjunctival pallor. Heart: Regular rate and rhythm, S1/S2 +, no murmurs, rubs, gallops. Lungs: Clear to auscultation bilaterally. No wheezing, no rhonchi, no rales.  Abdomen: Soft, nontender, nondistended, positive bowel sounds. Extremities: No clubbing or cyanosis, no pitting edema,  positive pedal pulses. Neuro: Grossly nonfocal.  Lab Results:  Basic Metabolic Panel:    Component Value Date/Time   NA 140 03/22/2011 0740   K 4.0 03/22/2011 0740   CL 104 03/22/2011 0740   CO2 26 03/22/2011 0740   BUN 21 03/22/2011 0740   CREATININE 1.69* 03/22/2011 0740   GLUCOSE 116* 03/22/2011 0740   CALCIUM 8.6 03/22/2011 0740   CBC:    Component Value Date/Time   WBC 4.3 03/22/2011 0740   HGB 8.1* 03/22/2011 0740   HCT 23.4* 03/22/2011 0740   PLT 193 03/22/2011 0740   MCV 89.0 03/22/2011 0740   NEUTROABS 3.7 03/21/2011 1855   LYMPHSABS 1.5 03/21/2011 1855   MONOABS 0.4 03/21/2011 1855   EOSABS 0.1 03/21/2011 1855   BASOSABS 0.1 03/21/2011 1855      Lab 03/22/11 0740 03/21/11 1855    WBC 4.3 5.8  HGB 8.1* 6.6*  HCT 23.4* 18.6*  PLT 193 258  MCV 89.0 93.5  MCH 30.8 33.2  MCHC 34.6 35.5  RDW 18.4* 17.7*  LYMPHSABS -- 1.5  MONOABS -- 0.4  EOSABS -- 0.1  BASOSABS -- 0.1  BANDABS -- --    Lab 03/22/11 0740 03/21/11 1855  NA 140 138  K 4.0 3.7  CL 104 102  CO2 26 24  GLUCOSE 116* 93  BUN 21 25*  CREATININE 1.69* 1.77*  CALCIUM 8.6 8.7  MG -- --    Lab 03/21/11 1855  INR 0.94  PROTIME --   Cardiac markers: No results found for this basename: CK:3,CKMB:3,TROPONINI:3,MYOGLOBIN:3 in the last 168 hours No components found with this basename: POCBNP:3 Recent Results (from the past 240 hour(s))  TECHNOLOGIST SMEAR REVIEW     Status: Normal   Collection Time   03/21/11  6:30 PM      Component Value Range Status Comment   Path Review POLYCHROMASIA PRESENT   Final     Studies/Results: Dg Chest 2 View  03/21/2011   IMPRESSION: No significant abnormalities.    Medications: Scheduled Meds:   . ALPRAZolam  0.25 mg Oral Once  . pantoprazole (PROTONIX) IV  80 mg  Intravenous Once  . sertraline  100 mg Oral Daily  . simvastatin  40 mg Oral QPC supper  . sodium chloride  3 mL Intravenous Q12H   Continuous Infusions:   . DISCONTD: sodium chloride 125 mL/hr at 03/21/11 2108  . DISCONTD: pantoprozole (PROTONIX) infusion 8 mg/hr (03/21/11 2019)   PRN Meds:.acetaminophen, acetaminophen, LORazepam, ondansetron (ZOFRAN) IV, ondansetron, zolpidem  Assessment/Plan:  Principal Problem:  *Anemia - unclear etiology at this time, ? Malignancy vs AVM - Hg/Hct  Stable post transfusion - will obtain GI consult for further evaluation and recommendations - obtain CBC in AM  Active Problems:  Hypertension - stable - continue to monitor per floor protocol   Renal insufficiency - creatinine trending down - BMP in AM   EDUCATION - test results and diagnostic studies were discussed with patient and pt's family who was present at the bedside - patient and family  have verbalized the understanding - questions were answered at the bedside and contact information was provided for additional questions or concerns   LOS: 1 day   MAGICK-Rigdon Macomber 03/22/2011, 8:01 PM  [facility] Pager: [phone]

## 2011-03-23 ENCOUNTER — Encounter (HOSPITAL_COMMUNITY)
Admission: EM | Disposition: A | Discharge status: Home / Self Care | Payer: Self-pay | Source: Home / Self Care | Attending: Internal Medicine

## 2011-03-23 ENCOUNTER — Encounter (HOSPITAL_COMMUNITY): Payer: Self-pay | Admitting: Gastroenterology

## 2011-03-23 ENCOUNTER — Other Ambulatory Visit: Payer: Self-pay | Admitting: Internal Medicine

## 2011-03-23 HISTORY — PX: ESOPHAGOGASTRODUODENOSCOPY: SHX5428

## 2011-03-23 LAB — CBC
HCT: 24.5 % — ABNORMAL LOW (ref 39.0–52.0)
Hemoglobin: 8.3 g/dL — ABNORMAL LOW (ref 13.0–17.0)
MCH: 30.7 pg (ref 26.0–34.0)
MCHC: 33.9 g/dL (ref 30.0–36.0)
MCV: 90.7 fL (ref 78.0–100.0)
Platelets: 204 10*3/uL (ref 150–400)
RBC: 2.7 MIL/uL — ABNORMAL LOW (ref 4.22–5.81)
RDW: 18.7 % — ABNORMAL HIGH (ref 11.5–15.5)
WBC: 5.5 10*3/uL (ref 4.0–10.5)

## 2011-03-23 LAB — BASIC METABOLIC PANEL
BUN: 18 mg/dL (ref 6–23)
CO2: 27 mEq/L (ref 19–32)
Calcium: 8.7 mg/dL (ref 8.4–10.5)
Chloride: 108 mEq/L (ref 96–112)
Creatinine, Ser: 1.67 mg/dL — ABNORMAL HIGH (ref 0.50–1.35)
GFR calc Af Amer: 49 mL/min — ABNORMAL LOW (ref >90)
GFR calc non Af Amer: 42 mL/min — ABNORMAL LOW (ref >90)
Glucose, Bld: 100 mg/dL — ABNORMAL HIGH (ref 70–99)
Potassium: 4.3 mEq/L (ref 3.5–5.1)
Sodium: 142 mEq/L (ref 135–145)

## 2011-03-23 LAB — OCCULT BLOOD X 1 CARD TO LAB, STOOL: Fecal Occult Bld: NEGATIVE

## 2011-03-23 SURGERY — EGD (ESOPHAGOGASTRODUODENOSCOPY)
Anesthesia: Moderate Sedation

## 2011-03-23 MED ORDER — BUTAMBEN-TETRACAINE-BENZOCAINE 2-2-14 % EX AERO
INHALATION_SPRAY | CUTANEOUS | Status: DC | PRN
  Administered 2011-03-23: 2 via TOPICAL

## 2011-03-23 MED ORDER — MIDAZOLAM HCL 10 MG/2ML IJ SOLN
INTRAMUSCULAR | Status: DC | PRN
  Administered 2011-03-23 (×4): 2 mg via INTRAVENOUS

## 2011-03-23 MED ORDER — SODIUM CHLORIDE 0.45 % IV SOLN: Freq: Once | INTRAVENOUS | Status: DC

## 2011-03-23 MED ORDER — PANTOPRAZOLE SODIUM 40 MG PO TBEC
40.0000 mg | DELAYED_RELEASE_TABLET | Freq: Every day | ORAL | Status: DC
  Administered 2011-03-23 – 2011-03-25 (×3): 40 mg via ORAL
  Filled 2011-03-23 (×3): qty 1

## 2011-03-23 MED ORDER — HYDROCODONE-ACETAMINOPHEN 5-325 MG PO TABS
1.0000 | ORAL_TABLET | ORAL | Status: DC | PRN
  Administered 2011-03-23 – 2011-03-24 (×3): 2 via ORAL
  Administered 2011-03-25 (×2): 1 via ORAL
  Filled 2011-03-23: qty 1
  Filled 2011-03-23 (×2): qty 2
  Filled 2011-03-23: qty 1
  Filled 2011-03-23: qty 2

## 2011-03-23 MED ORDER — FENTANYL NICU IV SYRINGE 50 MCG/ML
INJECTION | INTRAMUSCULAR | Status: DC | PRN
  Administered 2011-03-23 (×3): 25 ug via INTRAVENOUS

## 2011-03-23 MED ORDER — SODIUM CHLORIDE 0.9 % IV SOLN
INTRAVENOUS | Status: DC
  Administered 2011-03-23: 500 mL via INTRAVENOUS

## 2011-03-23 NOTE — Progress Notes (Signed)
Subjective: Pt denies any new complaints.   Objective: Weight change:   Intake/Output Summary (Last 24 hours) at 03/23/11 1751 Last data filed at 03/23/11 0900  Gross per 24 hour  Intake      3 ml  Output      1 ml  Net      2 ml    Filed Vitals:   03/23/11 1236  BP: 117/76  Pulse: 57  Temp: 97.8 F (36.6 C)  Resp: 18  Physical Exam:  General: Alert, awake, oriented x3, in no acute distress.  HEENT: No bruits, no goiter. Moist mucous membranes, no scleral icterus, no conjunctival pallor.  Heart: Regular rate and rhythm, S1/S2 +, no murmurs, rubs, gallops.  Lungs: Clear to auscultation bilaterally. No wheezing, no rhonchi, no rales.  Abdomen: Soft, nontender, nondistended, positive bowel sounds.  Extremities: No clubbing or cyanosis, no pitting edema, positive pedal pulses.  Neuro: Grossly nonfocal.    Lab Results: Results for orders placed during the hospital encounter of 03/21/11 (from the past 24 hour(s))  CBC     Status: Abnormal   Collection Time   03/23/11  5:00 AM      Component Value Range   WBC 5.5  4.0 - 10.5 (K/uL)   RBC 2.70 (*) 4.22 - 5.81 (MIL/uL)   Hemoglobin 8.3 (*) 13.0 - 17.0 (g/dL)   HCT 46.9 (*) 62.9 - 52.0 (%)   MCV 90.7  78.0 - 100.0 (fL)   MCH 30.7  26.0 - 34.0 (pg)   MCHC 33.9  30.0 - 36.0 (g/dL)   RDW 52.8 (*) 41.3 - 15.5 (%)   Platelets 204  150 - 400 (K/uL)  BASIC METABOLIC PANEL     Status: Abnormal   Collection Time   03/23/11  5:00 AM      Component Value Range   Sodium 142  135 - 145 (mEq/L)   Potassium 4.3  3.5 - 5.1 (mEq/L)   Chloride 108  96 - 112 (mEq/L)   CO2 27  19 - 32 (mEq/L)   Glucose, Bld 100 (*) 70 - 99 (mg/dL)   BUN 18  6 - 23 (mg/dL)   Creatinine, Ser 2.44 (*) 0.50 - 1.35 (mg/dL)   Calcium 8.7  8.4 - 01.0 (mg/dL)   GFR calc non Af Amer 42 (*) >90 (mL/min)   GFR calc Af Amer 49 (*) >90 (mL/min)  OCCULT BLOOD X 1 CARD TO LAB, STOOL     Status: Normal   Collection Time   03/23/11  9:52 AM      Component Value Range   Fecal Occult Bld NEGATIVE      Micro Results: Recent Results (from the past 240 hour(s))  TECHNOLOGIST SMEAR REVIEW     Status: Normal   Collection Time   03/21/11  6:30 PM      Component Value Range Status Comment   Path Review POLYCHROMASIA PRESENT   Final     Studies/Results: Dg Chest 2 View  03/21/2011  *RADIOLOGY REPORT*  Clinical Data: Shortness of breath.  Weakness.  Dizziness.  CHEST - 2 VIEW  Comparison: 09/09/2010  Findings: The heart size and vascularity are normal and the lungs are clear.  For a tiny stable granuloma in the left midzone.  No osseous abnormality.  IMPRESSION: No significant abnormalities.  Original Report Authenticated By: Gwynn Burly, M.D.   Medications: Scheduled Meds:   . pantoprazole  40 mg Oral Q1200  . sertraline  100 mg Oral Daily  . simvastatin  40 mg Oral QPC supper  . sodium chloride  3 mL Intravenous Q12H  . DISCONTD: sodium chloride   Intravenous Once  . DISCONTD: sodium chloride   Intravenous Once   Continuous Infusions:   . DISCONTD: sodium chloride 500 mL (03/23/11 1019)   PRN Meds:.acetaminophen, acetaminophen, HYDROcodone-acetaminophen, LORazepam, ondansetron (ZOFRAN) IV, ondansetron, zolpidem, DISCONTD: butamben-tetracaine-benzocaine, DISCONTD: fentaNYL, DISCONTD: midazolam  Assessment/Plan: Principal Problem:  *Anemia  - hgb stable. GI consult appreciated. S/p EGD today  Showing mild gastritis and duodenitis . ? GAVE. Will continue gi recommendations.  Active Problems:  Hypertension  - stable  - continue to monitor per floor protocol  Renal insufficiency  - creatinine trending down  - BMP in AM    LOS: 2 days   Sahand Gosch 03/23/2011, 5:51 PM

## 2011-03-23 NOTE — Op Note (Signed)
[facility] [address] [place], [place]  [address]  ENDOSCOPY PROCEDURE REPORT  PATIENT:  Montrez, Marietta  MR#:  [id number] BIRTHDATE:  10-18-1950, 60 yrs. old  GENDER:  male ENDOSCOPIST:  Carie Caddy. Bud Kaeser, MD Referred by:  [facility], PROCEDURE DATE:  03/23/2011 PROCEDURE:  EGD with biopsy, 432[address] ASA CLASS:  Class II INDICATIONS:  hemoccult positive stool, anemia MEDICATIONS:   Fentanyl 75 mcg IV, Versed 8 mg IV TOPICAL ANESTHETIC:  Cetacaine Spray  DESCRIPTION OF PROCEDURE:   After the risks benefits and alternatives of the procedure were thoroughly explained, informed consent was obtained.  The  endoscope was introduced through the mouth and advanced to the second portion of the duodenum, without limitations.  The instrument was slowly withdrawn as the mucosa was fully examined. <<PROCEDUREIMAGES>>  The esophagus and gastroesophageal junction were completely normal in appearance.  Mild erosive gastritis was found antrum.  Question early GAVE. Multiple biopsies were obtained and sent to pathology. Mild duodenitis was found in the bulb of the duodenum.  Normal otherwise in the second portion of the duodenum (examined portion).    Retroflexed views revealed no abnormalities.    The scope was then withdrawn from the patient and the procedure completed.  COMPLICATIONS:  None  ENDOSCOPIC IMPRESSION: 1) Normal esophagus 2) Mild gastritis in the antrum.  Question GAVE.  Biopsies performed. 3) Duodenitis in the bulb of duodenum. 4) Normal examined second portion duodenum  RECOMMENDATIONS: 1) Await biopsy results 2) Avoid NSAIDs 3) Further recommendations to be based on pathology results.  If GAVE could consider APC ablation, but 1st would like to exclude H. Pylori. 4) Begin daily PPI therapy for acid suppression  Latonya Knight M. Keisi Eckford, MD  CC:  The Patient  n. eSIGNED:   Carie Caddy. Jonisha Kindig at 03/23/2011 11:31 AM  Raelyn Number, [id number]

## 2011-03-24 ENCOUNTER — Encounter (HOSPITAL_COMMUNITY)
Admission: EM | Disposition: A | Discharge status: Home / Self Care | Payer: Self-pay | Source: Home / Self Care | Attending: Internal Medicine

## 2011-03-24 ENCOUNTER — Encounter (HOSPITAL_COMMUNITY): Payer: Self-pay | Admitting: Internal Medicine

## 2011-03-24 DIAGNOSIS — D649 Anemia, unspecified: Secondary | ICD-10-CM

## 2011-03-24 DIAGNOSIS — K921 Melena: Secondary | ICD-10-CM

## 2011-03-24 HISTORY — PX: GIVENS CAPSULE STUDY: SHX5432

## 2011-03-24 LAB — CBC
HCT: 25.4 % — ABNORMAL LOW (ref 39.0–52.0)
Hemoglobin: 8.9 g/dL — ABNORMAL LOW (ref 13.0–17.0)
MCH: 31.3 pg (ref 26.0–34.0)
MCHC: 35 g/dL (ref 30.0–36.0)
MCV: 89.4 fL (ref 78.0–100.0)
Platelets: 188 10*3/uL (ref 150–400)
RBC: 2.84 MIL/uL — ABNORMAL LOW (ref 4.22–5.81)
RDW: 17.3 % — ABNORMAL HIGH (ref 11.5–15.5)
WBC: 4.1 10*3/uL (ref 4.0–10.5)

## 2011-03-24 LAB — BASIC METABOLIC PANEL
BUN: 16 mg/dL (ref 6–23)
CO2: 26 mEq/L (ref 19–32)
Calcium: 8.8 mg/dL (ref 8.4–10.5)
Chloride: 104 mEq/L (ref 96–112)
Creatinine, Ser: 1.65 mg/dL — ABNORMAL HIGH (ref 0.50–1.35)
GFR calc Af Amer: 50 mL/min — ABNORMAL LOW (ref >90)
GFR calc non Af Amer: 43 mL/min — ABNORMAL LOW (ref >90)
Glucose, Bld: 90 mg/dL (ref 70–99)
Potassium: 3.8 mEq/L (ref 3.5–5.1)
Sodium: 138 mEq/L (ref 135–145)

## 2011-03-24 SURGERY — IMAGING PROCEDURE, GI TRACT, INTRALUMINAL, VIA CAPSULE

## 2011-03-24 MED ORDER — METOCLOPRAMIDE HCL 5 MG/ML IJ SOLN
10.0000 mg | Freq: Once | INTRAMUSCULAR | Status: AC
  Administered 2011-03-24: 10 mg via INTRAVENOUS
  Filled 2011-03-24: qty 2

## 2011-03-24 MED ORDER — SODIUM CHLORIDE 0.45 % IV SOLN: Freq: Once | INTRAVENOUS | Status: DC

## 2011-03-24 NOTE — Progress Notes (Signed)
Subjective: Pt comfortable denies any complaints. Looking forward to going home.   Objective: Weight change:  No intake or output data in the 24 hours ending 03/24/11 1153  Filed Vitals:   03/23/11 2219  BP: 124/67  Pulse: 66  Temp: 98.4 F (36.9 C)  Resp: 18   Physical Exam:  General: Alert, awake, oriented x3, in no acute distress.  HEENT: No bruits, no goiter. Moist mucous membranes, no scleral icterus, no conjunctival pallor.  Heart: Regular rate and rhythm, S1/S2 +, no murmurs, rubs, gallops.  Lungs: Clear to auscultation bilaterally. No wheezing, no rhonchi, no rales.  Abdomen: Soft, nontender, nondistended, positive bowel sounds.  Extremities: No clubbing or cyanosis, no pitting edema, positive pedal pulses.  Neuro: Grossly nonfocal.   Lab Results: No results found for this or any previous visit (from the past 24 hour(s)).   Micro Results: Recent Results (from the past 240 hour(s))  TECHNOLOGIST SMEAR REVIEW     Status: Normal   Collection Time   03/21/11  6:30 PM      Component Value Range Status Comment   Path Review POLYCHROMASIA PRESENT   Final     Studies/Results: Dg Chest 2 View  03/21/2011  *RADIOLOGY REPORT*  Clinical Data: Shortness of breath.  Weakness.  Dizziness.  CHEST - 2 VIEW  Comparison: 09/09/2010  Findings: The heart size and vascularity are normal and the lungs are clear.  For a tiny stable granuloma in the left midzone.  No osseous abnormality.  IMPRESSION: No significant abnormalities.  Original Report Authenticated By: Gwynn Burly, M.D.   Medications: Scheduled Meds:   . metoCLOPramide (REGLAN) injection  10 mg Intravenous Once  . pantoprazole  40 mg Oral Q1200  . sertraline  100 mg Oral Daily  . simvastatin  40 mg Oral QPC supper  . sodium chloride  3 mL Intravenous Q12H  . DISCONTD: sodium chloride   Intravenous Once  . DISCONTD: sodium chloride   Intravenous Once   Continuous Infusions:   . DISCONTD: sodium chloride 500 mL  (03/23/11 1019)   PRN Meds:.acetaminophen, acetaminophen, HYDROcodone-acetaminophen, LORazepam, ondansetron (ZOFRAN) IV, ondansetron, zolpidem  Assessment/Plan: Patient Active Hospital Problem List: Anemia  - hgb stable. GI consult appreciated. S/p EGD  Showing mild gastritis and duodenitis . ? GAVE. Biopsy results pending.  Will continue gi recommendations. Currently on video capsule endoscopy.  Will repeat labs today. Tolerating regular diet today.  Active Problems:  Hypertension  - stable  - continue to monitor per floor protocol  Renal insufficiency  - creatinine trending down  Labs today.    LOS: 3 days   Merlinda Wrubel 03/24/2011, 11:53 AM

## 2011-03-24 NOTE — Progress Notes (Signed)
Sensor aray patches applied to patients abdomen in prescribed manner. Data recorder attached to sensoraray   patches. Patient  Swallowed PillCam without difficulty. Instructions reviewed with patient and a copy of instructions given to patient nurse Alphonse Guild R.N.. PillCam lot [id number]. Becky Candida Vetter R.N.   [phone]

## 2011-03-25 ENCOUNTER — Encounter (HOSPITAL_COMMUNITY): Payer: Self-pay | Admitting: Internal Medicine

## 2011-03-25 ENCOUNTER — Encounter (HOSPITAL_COMMUNITY): Payer: Self-pay

## 2011-03-25 DIAGNOSIS — K922 Gastrointestinal hemorrhage, unspecified: Secondary | ICD-10-CM

## 2011-03-25 LAB — CBC
HCT: 26.3 % — ABNORMAL LOW (ref 39.0–52.0)
Hemoglobin: 8.9 g/dL — ABNORMAL LOW (ref 13.0–17.0)
MCH: 30.5 pg (ref 26.0–34.0)
MCHC: 33.8 g/dL (ref 30.0–36.0)
MCV: 90.1 fL (ref 78.0–100.0)
Platelets: 201 10*3/uL (ref 150–400)
RBC: 2.92 MIL/uL — ABNORMAL LOW (ref 4.22–5.81)
RDW: 16.7 % — ABNORMAL HIGH (ref 11.5–15.5)
WBC: 4.3 10*3/uL (ref 4.0–10.5)

## 2011-03-25 LAB — TYPE AND SCREEN
ABO/RH(D): O POS
Antibody Screen: NEGATIVE
Unit division: 0
Unit division: 0
Unit division: 0
Unit division: 0

## 2011-03-25 LAB — BASIC METABOLIC PANEL
BUN: 17 mg/dL (ref 6–23)
CO2: 27 mEq/L (ref 19–32)
Calcium: 9 mg/dL (ref 8.4–10.5)
Chloride: 107 mEq/L (ref 96–112)
Creatinine, Ser: 1.75 mg/dL — ABNORMAL HIGH (ref 0.50–1.35)
GFR calc Af Amer: 46 mL/min — ABNORMAL LOW (ref >90)
GFR calc non Af Amer: 40 mL/min — ABNORMAL LOW (ref >90)
Glucose, Bld: 98 mg/dL (ref 70–99)
Potassium: 4 mEq/L (ref 3.5–5.1)
Sodium: 141 mEq/L (ref 135–145)

## 2011-03-25 MED ORDER — LORAZEPAM 0.5 MG PO TABS: 0.5000 mg | ORAL_TABLET | Freq: Two times a day (BID) | ORAL | Status: AC | PRN

## 2011-03-25 MED ORDER — PANTOPRAZOLE SODIUM 40 MG PO TBEC: 40.0000 mg | DELAYED_RELEASE_TABLET | Freq: Every day | ORAL | Status: DC

## 2011-03-25 NOTE — Progress Notes (Signed)
Floor nurse removed capsule recorder during the night; we picked up equipment and sensors at 0820 this morning.

## 2011-03-25 NOTE — Progress Notes (Signed)
[facility] Progress Note  SUBJECTIVE: feels fine  OBJECTIVE:  Vital signs in last 24 hours: Temp:  [97.9 F (36.6 C)-98 F (36.7 C)] 98 F (36.7 C) (02/08 0541) Pulse Rate:  [64-97] 65  (02/08 0541) Resp:  [18-20] 18  (02/08 0541) BP: (120-148)/(67-83) 127/70 mmHg (02/08 0541) SpO2:  [96 %-100 %] 96 % (02/08 0541) Weight:  [83.915 kg (185 lb)] 83.915 kg (185 lb) (02/07 1030) Last BM Date: 03/24/11 General:    Pleasant white male in NAD Abdomen:  Soft, nontender and nondistended. Normal bowel sounds. Neurologic:  Alert and oriented,  grossly normal neurologically. Psych:  Cooperative. Normal mood and affect.   Lab Results:  Basename 03/25/11 0510 03/24/11 1150 03/23/11 0500  WBC 4.3 4.1 5.5  HGB 8.9* 8.9* 8.3*  HCT 26.3* 25.4* 24.5*  PLT 201 188 204   BMET  Basename 03/25/11 0510 03/24/11 1150 03/23/11 0500  NA 141 138 142  K 4.0 3.8 4.3  CL 107 104 108  CO2 27 26 27   GLUCOSE 98 90 100*  BUN 17 16 18   CREATININE 1.75* 1.65* 1.67*  CALCIUM 9.0 8.8 8.7    ASSESSMENT / PLAN:  1. Recurrent, profound anemia. Gastric biopsies were c/w chronic gastritis. 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 Plan is for labs and follow up appointment with Dr. Rhea Belton in two weeks. If hemoglobin declines, patient will likely need EGD with APC for questionable GAVE. Patient reports having had a colonoscopy 2.5 years ago in [place]. He will bring the report with him his appointment on 04/08/11 at 1:30pm. Patient okay for discharge from GI standpoint.    2.  Upper and lower extremity tremors, worse in evening ?anxiety. Ativan worked well. Patient plans to follow up with PCP on Monday but until then would like a few Ativan to get him through the weekend.   LOS: 4 days   George Jacobson  03/25/2011, 9:10 AM

## 2011-03-25 NOTE — Progress Notes (Signed)
I agree with the above documentation, including the assessment and plan. Will follow in clinic and follow counts. Consider repeat EGD if occult bleeding continues or anemia returns /fails to improve

## 2011-03-25 NOTE — Progress Notes (Signed)
Discharge instructions given to pt, verbalized understanding. Left the unit in stable condition. 

## 2011-03-26 ENCOUNTER — Encounter: Payer: Self-pay | Admitting: Internal Medicine

## 2011-03-28 NOTE — Progress Notes (Signed)
UR completed 

## 2011-04-05 NOTE — Discharge Summary (Signed)
DISCHARGE SUMMARY  George Jacobson  MR#: [id number]  DOB:August 07, 1950  Date of Admission: 03/21/2011 Date of Discharge: 04/05/2011  Attending Physician:George Jacobson  Patient's WUJ:[id number],NFAOZH, George Jacobson  Consults:Treatment Team:  George Blinks, Jacobson-  Discharge Diagnoses: Present on Admission:  .Anemia .Urothelial cancer .Hypertension .Renal disorder .Renal insufficiency    Medication List  As of 04/05/2011 11:19 PM   STOP taking these medications         aspirin EC 81 MG tablet      traMADol 50 MG tablet         TAKE these medications         ferrous sulfate 325 (65 FE) MG tablet   Take 325 mg by mouth 2 (two) times daily.      folic acid 800 MCG tablet   Commonly known as: FOLVITE   Take 800 mcg by mouth daily.      HYDROcodone-acetaminophen 5-500 MG per tablet   Commonly known as: VICODIN   Take 1 tablet by mouth every 8 (eight) hours as needed. For pain      pantoprazole 40 MG tablet   Commonly known as: PROTONIX   Take 1 tablet (40 mg total) by mouth daily at 12 noon.      sertraline 100 MG tablet   Commonly known as: ZOLOFT   Take 100 mg by mouth daily.      simvastatin 40 MG tablet   Commonly known as: ZOCOR   Take 40 mg by mouth every evening.      valsartan-hydrochlorothiazide 160-25 MG per tablet   Commonly known as: DIOVAN-HCT   Take 1 tablet by mouth daily.      zolpidem 5 MG tablet   Commonly known as: AMBIEN   Take 5 mg by mouth at bedtime as needed. 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 He was admitted to hospitalist service for evaluation of anemia.    Hospital Course: Anemia  - hgb stable.S/p EGD Showing mild gastritis and duodenitis . ? GAVE. Biopsy results pending.  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 Follow up with George Jacobson in 2 weeks to follow up biopsy results. Currently tolerating a regular diet.  Active Problems:  Hypertension  - stable  Renal insufficiency  - creatinine trending down      Day of Discharge BP 120/65  Pulse 66  Temp(Src) 98.2 F (36.8 C) (Oral)  Resp 18  Ht 5\' 10"  (1.778 m)  Wt 83.915 kg (185 lb)  BMI 26.54 kg/m2  SpO2 97%  Physical Exam: General: Alert, awake, oriented x3, in no acute distress.  HEENT: No bruits, no goiter. Moist mucous membranes, no scleral icterus, no conjunctival pallor.  Heart: Regular rate and rhythm, S1/S2 +, no murmurs, rubs, gallops.  Lungs: Clear to auscultation bilaterally. No wheezing, no rhonchi, no rales.  Abdomen: Soft, nontender, nondistended, positive bowel sounds.  Extremities: No clubbing or cyanosis, no pitting edema, positive pedal pulses.  Neuro: Grossly nonfocal.   No results found for this or any previous visit (from the past 24 hour(s)).  Disposition: home   Follow-up Appts: Discharge Orders    Future Appointments: Provider: Department: Dept Phone: Jacobson:   04/08/2011 9:00 AM [facility] [phone] None   04/15/2011 2:30 PM George Blinks, Jacobson [facility] [phone] [facility]     Future Orders Please Complete By Expires   Diet - low sodium heart healthy      Discharge instructions      Comments:   Follow up with [facility] as recommended.   Activity as tolerated - No restrictions         Follow-up Information    Follow up with George Fiedler, Jacobson on 04/08/2011. (1:30pm. Please come 2 hours early for labs. )    Contact information:   [address] [place] [address] [phone]       Follow up with George Jacobson, In, Jacobson in 1 week.   Contact information:   [address] [place] [address] [phone]          Tests Needing Follow-up: Cbc in one week.   Time spent in discharge (includes decision making & examination of pt): 45  minutes  Signed: [name] 04/05/2011, 11:19 PM

## 2011-04-08 ENCOUNTER — Ambulatory Visit: Payer: BC Managed Care – PPO | Admitting: Internal Medicine

## 2011-04-08 ENCOUNTER — Other Ambulatory Visit: Payer: BC Managed Care – PPO

## 2011-04-14 ENCOUNTER — Encounter: Payer: Self-pay | Admitting: Internal Medicine

## 2011-04-15 ENCOUNTER — Other Ambulatory Visit: Payer: Self-pay | Admitting: Internal Medicine

## 2011-04-15 ENCOUNTER — Ambulatory Visit (INDEPENDENT_AMBULATORY_CARE_PROVIDER_SITE_OTHER): Payer: BC Managed Care – PPO | Admitting: Internal Medicine

## 2011-04-15 ENCOUNTER — Other Ambulatory Visit (INDEPENDENT_AMBULATORY_CARE_PROVIDER_SITE_OTHER): Payer: BC Managed Care – PPO

## 2011-04-15 ENCOUNTER — Telehealth: Payer: Self-pay | Admitting: Internal Medicine

## 2011-04-15 ENCOUNTER — Encounter: Payer: Self-pay | Admitting: Internal Medicine

## 2011-04-15 DIAGNOSIS — D649 Anemia, unspecified: Secondary | ICD-10-CM

## 2011-04-15 DIAGNOSIS — F419 Anxiety disorder, unspecified: Secondary | ICD-10-CM

## 2011-04-15 DIAGNOSIS — K2990 Gastroduodenitis, unspecified, without bleeding: Secondary | ICD-10-CM

## 2011-04-15 DIAGNOSIS — F329 Major depressive disorder, single episode, unspecified: Secondary | ICD-10-CM

## 2011-04-15 DIAGNOSIS — K2971 Gastritis, unspecified, with bleeding: Secondary | ICD-10-CM | POA: Insufficient documentation

## 2011-04-15 DIAGNOSIS — F411 Generalized anxiety disorder: Secondary | ICD-10-CM

## 2011-04-15 DIAGNOSIS — Z86010 Personal history of colonic polyps: Secondary | ICD-10-CM

## 2011-04-15 DIAGNOSIS — K5520 Angiodysplasia of colon without hemorrhage: Secondary | ICD-10-CM

## 2011-04-15 DIAGNOSIS — K2970 Gastritis, unspecified, without bleeding: Secondary | ICD-10-CM

## 2011-04-15 DIAGNOSIS — Q2733 Arteriovenous malformation of digestive system vessel: Secondary | ICD-10-CM

## 2011-04-15 LAB — CBC WITH DIFFERENTIAL/PLATELET
Basophils Absolute: 0 10*3/uL (ref 0.0–0.1)
Basophils Relative: 0.6 % (ref 0.0–3.0)
Eosinophils Absolute: 0.1 10*3/uL (ref 0.0–0.7)
Eosinophils Relative: 2.6 % (ref 0.0–5.0)
HCT: 33.1 % — ABNORMAL LOW (ref 39.0–52.0)
Hemoglobin: 11.4 g/dL — ABNORMAL LOW (ref 13.0–17.0)
Lymphocytes Relative: 32.9 % (ref 12.0–46.0)
Lymphs Abs: 1.6 10*3/uL (ref 0.7–4.0)
MCHC: 34.6 g/dL (ref 30.0–36.0)
MCV: 89.4 fl (ref 78.0–100.0)
Monocytes Absolute: 0.5 10*3/uL (ref 0.1–1.0)
Monocytes Relative: 11.1 % (ref 3.0–12.0)
Neutro Abs: 2.5 10*3/uL (ref 1.4–7.7)
Neutrophils Relative %: 52.8 % (ref 43.0–77.0)
Platelets: 239 10*3/uL (ref 150.0–400.0)
RBC: 3.7 Mil/uL — ABNORMAL LOW (ref 4.22–5.81)
RDW: 14.7 % — ABNORMAL HIGH (ref 11.5–14.6)
WBC: 4.8 10*3/uL (ref 4.5–10.5)

## 2011-04-15 MED ORDER — PANTOPRAZOLE SODIUM 40 MG PO TBEC: 40.0000 mg | DELAYED_RELEASE_TABLET | Freq: Every day | ORAL | Status: DC

## 2011-04-15 NOTE — Patient Instructions (Signed)
Take pantoprozole 1 tablet daily.  Please go to the lab in the basement in June to repeat labs.  Dr. Rhea Belton would like to see you back in the office in 3 months for a follow up

## 2011-04-15 NOTE — Progress Notes (Signed)
Subjective:    Patient ID: George Jacobson, male    DOB: 1950/10/11, 61 y.o.   MRN: [id number]  HPI George Jacobson is a 61 year old male with a past medical history of urothelial cancer status post nephrectomy, hypertension, who was recently hospitalized with worsening anemia with heme positive stool felt to be secondary to GI blood loss. He returns today with his wife for followup. He reports overall he is feeling better, and has significantly less fatigue. He also denies bowel pain, nausea, vomiting. No heartburn. Bowel movements are normal for him, and he has been slightly constipated which is not new. In the past he tried MiraLAX for this, which has worked but is not taking this now. He denies melena or bright red blood per rectum. He has remained on pantoprazole 40 mg daily. No fevers or chills. He does endorse ongoing depression with anxiety. His PCP has started him on clonazepam which has helped some with anxiety, but left him blunted per his wife. She still feels that he is not himself. He does endorse feeling depressed, but no suicidality or homicidal ideation. He remains on sertraline 100 mg daily which is long-standing for him.  In hospital he was evaluated EGD which revealed antral gastritis, H. pylori negative. 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 Colonoscopy is now performed in the hospital, but was done in September 2009 in [place] and was found to be normal to the cecum.  Review of Systems As per history of present illness otherwise negative  Patient Active Problem List  Diagnoses  . Urothelial cancer  . Hypertension  . Anemia  . Renal insufficiency  . Heme positive stool   Current Outpatient Prescriptions  Medication Sig Dispense Refill  . ClonazePAM (KLONOPIN PO) Take 1 tablet by mouth 3 (three) times daily.      . folic acid  (FOLVITE) 800 MCG tablet Take 800 mcg by mouth daily.      [name] HYDROcodone-acetaminophen (VICODIN) 5-500 MG per tablet Take 1 tablet by mouth every 8 (eight) hours as needed. For pain      . pantoprazole (PROTONIX) 40 MG tablet Take 1 tablet (40 mg total) by mouth daily at 12 noon.  30 tablet  0  . sertraline (ZOLOFT) 100 MG tablet Take 100 mg by mouth daily.      . simvastatin (ZOCOR) 40 MG tablet Take 40 mg by mouth every evening.      . valsartan-hydrochlorothiazide (DIOVAN-HCT) 160-25 MG per tablet Take 1 tablet by mouth daily.      [name] zolpidem (AMBIEN) 5 MG tablet Take 5 mg by mouth at bedtime as needed. For insomnia      . pantoprazole (PROTONIX) 40 MG tablet Take 1 tablet (40 mg total) by mouth daily.  30 tablet  11   Allergies  Allergen Reactions  . Sulfa Antibiotics     Headaches  . Tetracyclines & Related     Nausea   Family History  Problem Relation Age of Onset  . Hypertension Father   . Breast cancer Sister    Social History  . Marital Status: Married    Number of Children: 2   Occupational History  . OWNER    Social History Main Topics  . Smoking status: Former Smoker -- 1.0 packs/day for 25 years    Types: Cigarettes    Quit date: 02/14/1998  . Smokeless tobacco: Never  Used  . Alcohol Use: Yes     2 nights a week, not much though  . 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 Semi-retired. Have two children, and 2 grandchildren. No cane or walker. [name] = wife.       Objective:   Physical Exam BP 110/58  Pulse 80  Ht 5\' 10"  (1.778 m)  Wt 197 lb (89.359 kg)  BMI 28.27 kg/m2 Constitutional: Well-developed and well-nourished. No distress. HEENT: Normocephalic and atraumatic. Oropharynx is clear and moist. No oropharyngeal exudate. Conjunctivae are normal. Pupils are equal round and reactive to light. No scleral icterus. Neck: Neck supple. Trachea  midline. Cardiovascular: Normal rate, regular rhythm and intact distal pulses. No M/R/G Pulmonary/chest: Effort normal and breath sounds normal. No wheezing, rales or rhonchi. Abdominal: Soft, nontender, nondistended. Bowel sounds active throughout. There are no masses palpable. No hepatosplenomegaly. Extremities: no clubbing, cyanosis, or edema Lymphadenopathy: No cervical adenopathy noted. Neurological: Alert and oriented to person place and time. Skin: Skin is warm and dry. No rashes noted. Psychiatric: Normal behavior, somewhat flat affect.  Component     Latest Ref Rng 11/10/2010 03/21/2011 03/22/2011 03/23/2011 03/24/2011  WBC     4.5 - 10.5 K/uL 7.6 5.8 4.3 5.5 4.1  RBC     4.22 - 5.81 Mil/uL 3.07 (L) 1.99 (L) 2.63 (L) 2.70 (L) 2.84 (L)  HGB     13.0 - 17.0 g/dL 9.5 (L) 6.6 (LL) 8.1 (L) 8.3 (L) 8.9 (L)  HCT     39.0 - 52.0 % 27.5 (L) 18.6 (L) 23.4 (L) 24.5 (L) 25.4 (L)  MCV     78.0 - 100.0 fl 89.6 93.5 89.0 90.7 89.4  MCHC     30.0 - 36.0 g/dL 16.1 09.6 04.5 40.9 81.1  RDW     11.5 - 14.6 % 12.3 17.7 (H) 18.4 (H) 18.7 (H) 17.3 (H)  Platelets     150.0 - 400.0 K/uL 145 (L) 258 193 204 188  Neutrophils Relative     43.0 - 77.0 %  65     Lymphocytes Relative     12.0 - 46.0 %  26     Monocytes Relative     3.0 - 12.0 %  7     Eosinophils Relative     0.0 - 5.0 %  1     Basophils Relative     0.0 - 3.0 %  1     Neutrophils Absolute     1.4 - 7.7 K/uL  3.7     Lymphocytes Absolute     0.7 - 4.0 K/uL  1.5     Monocytes Absolute     0.1 - 1.0 K/uL  0.4     Eosinophils Absolute     0.0 - 0.7 K/uL  0.1     Basophils Absolute     0.0 - 0.1 K/uL  0.1      Component     Latest Ref Rng 03/25/2011 04/15/2011  WBC     4.5 - 10.5 K/uL 4.3 4.8  RBC     4.22 - 5.81 Mil/uL 2.92 (L) 3.70 (L)  HGB     13.0 - 17.0 g/dL 8.9 (L) 91.4 (L)  HCT     39.0 - 52.0 % 26.3 (L) 33.1 (L)  MCV     78.0 - 100.0 fl 90.1 89.4  MCHC  30.0 - 36.0 g/dL 86.5 78.4  RDW     69.6 - 14.6 % 16.7 (H)  14.7 (H)  Platelets     150.0 - 400.0 K/uL 201 239.0  Neutrophils Relative     43.0 - 77.0 %  52.8  Lymphocytes Relative     12.0 - 46.0 %  32.9  Monocytes Relative     3.0 - 12.0 %  11.1  Eosinophils Relative     0.0 - 5.0 %  2.6  Basophils Relative     0.0 - 3.0 %  0.6  Neutrophils Absolute     1.4 - 7.7 K/uL  2.5  Lymphocytes Absolute     0.7 - 4.0 K/uL  1.6  Monocytes Absolute     0.1 - 1.0 K/uL  0.5  Eosinophils Absolute     0.0 - 0.7 K/uL  0.1  Basophils Absolute     0.0 - 0.1 K/uL  0.0   Colon report -- see #2 below reports be scanned into Epic    Assessment & Plan:  61 year old male with a past medical history of urothelial cancer status post nephrectomy, hypertension, who was recently hospitalized with worsening anemia with heme positive stool felt to be secondary to GI blood loss return for followup.  1. Occult GI blood loss/anemia -- the patient had CBC today, and his hemoglobin is significantly better than it was at discharge from hospital earlier in February. Hemoglobin today is 11.4, and was 8.9 on hospital discharge 03/25/2011. His symptoms have improved from an anemia standpoint and he is without overt GI bleeding presently. 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  Also one small bowel angiectasia was seen on capsule which could also explain occult blood loss.  Either way, his blood counts have improved dramatically and there is no evidence for ongoing bleeding, therefore I do not think any further procedure is necessary at present.  His iron studies were normal on 03/21/2011 and therefore I do not think he needs iron supplementation. For now I recommend he continue pantoprazole daily, and we will see him back in 3 months time and also check his blood counts and iron stores on that day.  I've asked that he contact my office if he sees overt bleeding such as melena or bright red blood per rectum or again  develop symptoms of anemia such as fatigue dyspnea on exertion et Karie Soda.  2. Hx of adenomatous colon polyps -- the patient had surveillance colonoscopy on 10/15/2008 and he brings his report today. This was normal to the cecum, therefore he is due repeat surveillance in September 2015  3. Depression/anxiety -- the patient seems to struggle with ongoing depression. He is on Zoloft 100 mg daily clonazepam 3 times a day at present. His exam is less of an issue on benzodiazepine, however his depression remains. I've asked that he discuss this with his primary care provider soon. Both he and his wife voiced understanding, and plan to do so.

## 2011-04-19 NOTE — Telephone Encounter (Signed)
Orders entered by Kennyth Arnold

## 2011-04-25 ENCOUNTER — Encounter: Payer: Self-pay | Admitting: Internal Medicine

## 2011-06-02 ENCOUNTER — Telehealth: Payer: Self-pay | Admitting: Internal Medicine

## 2011-06-02 DIAGNOSIS — K922 Gastrointestinal hemorrhage, unspecified: Secondary | ICD-10-CM

## 2011-06-02 NOTE — Telephone Encounter (Signed)
Pt reports he was out of town last weak and began feeling weaker and weaker. Yesterday he had dark blood after his stool and again this am. He was told to call if he bled again. Please advise. Thanks.

## 2011-06-02 NOTE — Telephone Encounter (Signed)
We need to check a CBC. If bleeding is quite, voluminous, etc. he is to seek emergency care Thanks

## 2011-06-02 NOTE — Telephone Encounter (Signed)
Notified pt of need for CBC and he is to seek emergent care for excessive bleeding. Pt stated understanding and will in am and hang around [place] until he hears from [place].

## 2011-06-03 ENCOUNTER — Other Ambulatory Visit (INDEPENDENT_AMBULATORY_CARE_PROVIDER_SITE_OTHER): Payer: BC Managed Care – PPO

## 2011-06-03 ENCOUNTER — Other Ambulatory Visit: Payer: Self-pay | Admitting: Internal Medicine

## 2011-06-03 DIAGNOSIS — K922 Gastrointestinal hemorrhage, unspecified: Secondary | ICD-10-CM

## 2011-06-03 DIAGNOSIS — Z8719 Personal history of other diseases of the digestive system: Secondary | ICD-10-CM

## 2011-06-03 LAB — CBC WITH DIFFERENTIAL/PLATELET
Basophils Absolute: 0 10*3/uL (ref 0.0–0.1)
Basophils Relative: 0.5 % (ref 0.0–3.0)
Eosinophils Absolute: 0.1 10*3/uL (ref 0.0–0.7)
Eosinophils Relative: 2 % (ref 0.0–5.0)
HCT: 36.3 % — ABNORMAL LOW (ref 39.0–52.0)
Hemoglobin: 12.3 g/dL — ABNORMAL LOW (ref 13.0–17.0)
Lymphocytes Relative: 26.5 % (ref 12.0–46.0)
Lymphs Abs: 1.4 10*3/uL (ref 0.7–4.0)
MCHC: 33.8 g/dL (ref 30.0–36.0)
MCV: 88.7 fl (ref 78.0–100.0)
Monocytes Absolute: 0.6 10*3/uL (ref 0.1–1.0)
Monocytes Relative: 10.5 % (ref 3.0–12.0)
Neutro Abs: 3.2 10*3/uL (ref 1.4–7.7)
Neutrophils Relative %: 60.5 % (ref 43.0–77.0)
Platelets: 186 10*3/uL (ref 150.0–400.0)
RBC: 4.1 Mil/uL — ABNORMAL LOW (ref 4.22–5.81)
RDW: 13.2 % (ref 11.5–14.6)
WBC: 5.3 10*3/uL (ref 4.5–10.5)

## 2011-08-25 ENCOUNTER — Other Ambulatory Visit: Payer: Self-pay | Admitting: Urology

## 2011-09-21 ENCOUNTER — Encounter (HOSPITAL_COMMUNITY): Payer: Self-pay | Admitting: Pharmacy Technician

## 2011-10-05 ENCOUNTER — Encounter (HOSPITAL_COMMUNITY): Payer: Self-pay

## 2011-10-05 ENCOUNTER — Encounter (HOSPITAL_COMMUNITY)
Admission: RE | Admit: 2011-10-05 | Discharge: 2011-10-05 | Disposition: A | Discharge status: Home / Self Care | Payer: BC Managed Care – PPO | Source: Ambulatory Visit | Attending: Urology | Admitting: Urology

## 2011-10-05 HISTORY — DX: Sleep apnea, unspecified: G47.30

## 2011-10-05 HISTORY — DX: Pneumonia, unspecified organism: J18.9

## 2011-10-05 LAB — BASIC METABOLIC PANEL
BUN: 24 mg/dL — ABNORMAL HIGH (ref 6–23)
CO2: 30 mEq/L (ref 19–32)
Calcium: 9.3 mg/dL (ref 8.4–10.5)
Chloride: 99 mEq/L (ref 96–112)
Creatinine, Ser: 1.64 mg/dL — ABNORMAL HIGH (ref 0.50–1.35)
GFR calc Af Amer: 50 mL/min — ABNORMAL LOW (ref >90)
GFR calc non Af Amer: 43 mL/min — ABNORMAL LOW (ref >90)
Glucose, Bld: 92 mg/dL (ref 70–99)
Potassium: 3.7 mEq/L (ref 3.5–5.1)
Sodium: 137 mEq/L (ref 135–145)

## 2011-10-05 LAB — CBC
HCT: 32 % — ABNORMAL LOW (ref 39.0–52.0)
Hemoglobin: 11.3 g/dL — ABNORMAL LOW (ref 13.0–17.0)
MCH: 30.7 pg (ref 26.0–34.0)
MCHC: 35.3 g/dL (ref 30.0–36.0)
MCV: 87 fL (ref 78.0–100.0)
Platelets: 194 10*3/uL (ref 150–400)
RBC: 3.68 MIL/uL — ABNORMAL LOW (ref 4.22–5.81)
RDW: 12 % (ref 11.5–15.5)
WBC: 4.3 10*3/uL (ref 4.0–10.5)

## 2011-10-05 LAB — SURGICAL PCR SCREEN
MRSA, PCR: NEGATIVE
Staphylococcus aureus: POSITIVE — AB

## 2011-10-05 NOTE — Patient Instructions (Signed)
YOUR SURGERY IS SCHEDULED ON:   Thursday  8/22  AT 9:00 AM  REPORT TO [facility] AT: 7:00 AM      PHONE # FOR SHORT STAY IS [phone]  DO NOT EAT OR DRINK ANYTHING AFTER MIDNIGHT THE NIGHT BEFORE YOUR SURGERY.  YOU MAY BRUSH YOUR TEETH, RINSE OUT YOUR MOUTH--BUT NO WATER, NO FOOD, NO CHEWING GUM, NO MINTS, NO CANDIES, NO CHEWING TOBACCO.  PLEASE TAKE THE FOLLOWING MEDICATIONS THE AM OF YOUR SURGERY WITH A FEW SIPS OF WATER:  PROTONIX, CLONAZEPAM, LEXAPRO, HYDROCODONE/ACETAMINOPHEN, SIMVASTATIN.    IF YOU USE INHALERS--USE YOUR INHALERS THE AM OF YOUR SURGERY AND BRING INHALERS TO THE HOSPITAL -TAKE TO SURGERY.    IF YOU ARE DIABETIC:  DO NOT TAKE ANY DIABETIC MEDICATIONS THE AM OF YOUR SURGERY.  IF YOU TAKE INSULIN IN THE EVENINGS--PLEASE ONLY TAKE 1/2 NORMAL EVENING DOSE THE NIGHT BEFORE YOUR SURGERY.  NO INSULIN THE AM OF YOUR SURGERY.  IF YOU HAVE SLEEP APNEA AND USE CPAP OR BIPAP--PLEASE BRING THE MASK --NOT THE MACHINE-NOT THE TUBING   -JUST THE MASK. DO NOT BRING VALUABLES, MONEY, CREDIT CARDS.  CONTACT LENS, DENTURES / PARTIALS, GLASSES SHOULD NOT BE WORN TO SURGERY AND IN MOST CASES-HEARING AIDS WILL NEED TO BE REMOVED.  BRING YOUR GLASSES CASE, ANY EQUIPMENT NEEDED FOR YOUR CONTACT LENS. FOR PATIENTS ADMITTED TO THE HOSPITAL--CHECK OUT TIME THE DAY OF DISCHARGE IS 11:00 AM.  ALL INPATIENT ROOMS ARE PRIVATE - WITH BATHROOM, TELEPHONE, TELEVISION AND WIFI INTERNET. IF YOU ARE BEING DISCHARGED THE SAME DAY OF YOUR SURGERY--YOU CAN NOT DRIVE YOURSELF HOME--AND SHOULD NOT GO HOME ALONE BY TAXI OR BUS.  NO DRIVING OR OPERATING MACHINERY FOR 24 HOURS FOLLOWING ANESTHESIA / PAIN MEDICATIONS.                            SPECIAL INSTRUCTIONS:  CHLORHEXIDINE SOAP SHOWER (other brand names are Betasept and Hibiclens ) PLEASE SHOWER WITH CHLORHEXIDINE THE NIGHT BEFORE YOUR SURGERY AND THE AM OF YOUR SURGERY. DO NOT USE CHLORHEXIDINE ON YOUR FACE OR PRIVATE AREAS--YOU MAY USE  YOUR NORMAL SOAP THOSE AREAS AND YOUR NORMAL SHAMPOO.  WOMEN SHOULD AVOID SHAVING UNDER ARMS AND SHAVING LEGS 48 HOURS BEFORE USING CHLORHEXIDINE TO AVOID SKIN IRRITATION.  DO NOT USE IF ALLERGIC TO CHLORHEXIDINE.  PLEASE READ OVER ANY  FACT SHEETS THAT YOU WERE GIVEN: MRSA INFORMATION

## 2011-10-05 NOTE — H&P (Signed)
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 Vonita Moss in June 2010 with gross hematuria.  He was found to have a 1 cm left renal calculus without other worrisome findings on CT imaging or cystoscopy. However, he did have a persistently positive NMP-22.  In June 2011, he had an atypical cytology and this apparently prompting an endoscopic evaluation at which time he was found to have a filling defect in the right renal pelvis.  Ureteroscopy demonstrated a renal pelvic tumor which appeared low grade.  He underwent laser ablation at that time.  He has subsequently required repeated ureteroscopic laser ablation procedures for recurrent/persistent tumor. I assumed his care in June 2012.  I evaluated him in the OR for the first time in July 2012 and found a 3 cm tumor in the renal pelvis.  He underwent biopsy and laser ablation although the tumor was unable to be completely ablated. Repeat attempts at ablation in August 2012 confirmed high volume residual tumor and an inability to completely ablate endoscopically.  I therefore recommended a nephroureterectomy for definitive treatment. He underwent a right RAL nephroureterectomy on 11/08/10.  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  2) Urolithiasis: He has a known 1 cm left renal calculus.  3) Prostate cancer screening: Last PSA: 0.52 (June 2012)   Past Medical History Problems  1. History of  Arthritis V13.4 2. History of  Asthma 493.90 3. History of  Depression 311 4. History of  Esophageal Reflux 530.81 5. History of  Hypercholesterolemia 272.0 6. History of  Hypertension 401.9 7. History of  Sleep Apnea 780.57  Surgical History Problems  1. History of  Cystoscopy With Insertion Of Ureteral Stent Right 2. History of  Cystoscopy With  Insertion Of Ureteral Stent Right 3. History of  Cystoscopy With Insertion Of Ureteral Stent Right 4. History of  Cystoscopy With Resection Of Tumor 5. History of  Cystoscopy With Resection Of Tumor 6. History of  Cystoscopy With Resection Of Tumor 7. History of  Cystoscopy With Resection Of Tumor 8. History of  Cystoscopy With Ureteroscopy For Biopsy Right 9. History of  Ear Surgery 10. History of  Foot Surgery 11. History of  Kidney Surgery Laparoscopically Assisted Nephroureterectomy 12. History of  Varicose Vein Ligation  Current Meds 1. Abilify 5 MG Oral Tablet; Therapy: 18Mar2013 to 2. Ambien 5 MG Oral Tablet; Therapy: (Recorded:03Jun2010) to 3. Aspirin 81 MG Oral Tablet; Therapy: (Recorded:03Jun2010) to 4. Azithromycin 500 MG Oral Tablet; Therapy: 18Dec2012 to 5. Diovan HCT 160-25 MG Oral Tablet; Therapy: 18Mar2012 to 6. Fish Oil Concentrate 1000 MG Oral Capsule; Therapy: (Recorded:04Sep2012) to 7. Folic Acid 800 MCG Oral Tablet; Therapy: (Recorded:03Jun2010) to 8. Hydrocodone-Acetaminophen 5-325 MG Oral Tablet; take  1-2 tab po q 4 hours prn pain; Therapy:  14Jul2011 to (Last Rx:03Aug2012) 9. Lipitor 10 MG Oral Tablet; Therapy: (Recorded:03Jun2010) to 10. Meloxicam 7.5 MG Oral Tablet; TAKE 1 TABLET DAILY; Therapy: 24Oct2012 to   (Evaluate:23Nov2012)  Requested for: 24Oct2012; Last Rx:24Oct2012 11. OLANZapine 2.5 MG Oral Tablet; Therapy: 01Oct2012 to 12. Pantoprazole Sodium 40 MG Oral Tablet Delayed Release; Therapy: 08Feb2013 to 13. Sertraline HCl 100 MG Oral Tablet; Therapy: 16Jul2012 to 14. Simvastatin 40 MG Oral Tablet; Therapy: 17Apr2012 to 15. TraMADol HCl 50 MG Oral Tablet; Therapy: (Recorded:03Jun2010) to  Allergies Medication  1. SMZ-TMP DS TABS  2. Sulfamethoxazole-TMP DS TABS 3. Sulfamethoxazole-Trimethoprim SOLN 4. Tetracyclines  Family History Problems  1. Paternal history of  Family Health Status - Father's Age 62 yrs old 2. Maternal history of  Family  Health Status - Mother's Age 7 yrs old 3. Family history of  Family Health Status Number Of Children 1 son1 daughter 4. Family history of  No Significant Family History  Social History Problems  1. Alcohol Use 4 per day 2. Caffeine Use 2 per day 3. Former Smoker smoked 1 ppd for 25 yrs and smoke free for 10 yrs 4. Marital History - Currently Married 5. Occupation: Electronics engineer  Review of Systems  Genitourinary: no hematuria.  Gastrointestinal: no nausea and no vomiting.  Constitutional: no fever.    Physical Exam Constitutional: Well nourished and well developed . No acute distress.  ENT:. The ears and nose are normal in appearance.  Neck: The appearance of the neck is normal and no neck mass is present.  Pulmonary: No respiratory distress and normal respiratory rhythm and effort.  Cardiovascular:. No peripheral edema.  Abdomen: The abdomen is soft and nontender. No masses are palpated. No CVA tenderness. No hernias are palpable. No hepatosplenomegaly noted.  Rectal: Prostate size is estimated to be g. The prostate has no nodularity and is not indurated.  Genitourinary: Examination of the penis demonstrates no discharge, no masses, no lesions and a normal meatus. The scrotum is without lesions. The right epididymis is palpably normal and non-tender. The left epididymis is palpably normal and non-tender. The right testis is non-tender and without masses. The left testis is non-tender and without masses.  Lymphatics: The femoral and inguinal nodes are not enlarged or tender.  Skin: Normal skin turgor, no visible rash and no visible skin lesions.  Neuro/Psych:. Mood and affect are appropriate.    Results/Data Urine [Data Includes: Last 1 Day]   10Jul2013  COLOR YELLOW   APPEARANCE CLEAR   SPECIFIC GRAVITY 1.010   pH 7.0   GLUCOSE NEG mg/dL  BILIRUBIN NEG   KETONE NEG mg/dL  BLOOD NEG   PROTEIN NEG mg/dL  UROBILINOGEN 0.2 mg/dL  NITRITE NEG   LEUKOCYTE ESTERASE  NEG   PSA Flowsheet [Data Includes: All]   05Jun2012 07Jun2011 03Jun2010  PSA 0.52 ng/mL 0.59 ng/mL 0.57 ng/mL  Free PSA     % Free PSA     Renal Function [Data Includes: All]   20Dec2012 27Aug2012 05Jun2012  Creatinine 2.18 mg/dL 2.84 mg/dL 1.32 mg/dL     Assessment Assessed  1. Renal Pelvis Carcinoma Right 189.1 2. Nephrolithiasis Of The Left Kidney 592.0 3. Visit For: Screening Exam Malignant Neoplasm Prostate V76.44    Discussion/Summary 1.  Left solitary kidney/left renal calculus/chronic kidney disease: We therefore reviewed various treatment options including shockwave lithotripsy, percutaneous nephrolithotomy, and ureteroscopic laser lithotripsy. After reviewing the pros and cons of each approach, he does wish to proceed with left ureteroscopic laser lithotripsy. He understands the potential risks, complications, alternative options, and expected recovery process. He understands that I will plan to leave a ureteral stent in place for approximately 2 weeks in order to avoid any risk for postoperative obstruction.

## 2011-10-05 NOTE — Pre-Procedure Instructions (Signed)
PREOP CBC, DIFF, EKG WERE DONE AT [facility] TODAY 10/05/11.  THE EKG WAS REPEATED TODAY BECAUSE PREVIOUS EKG 03/21/11 ABNORMAL (REPORT IN EPIC).  TODAY'S EKG READS SINUS BRADYCARDIA, RATE 55. PT HAS CXR REPORT IN EPIC FROM 03/21/11. PREOP INSTRUCTIONS DISCUSSED WITH PT USING THE TEACH BACK METHOD.

## 2011-10-05 NOTE — Pre-Procedure Instructions (Signed)
PREOP BMET REPORT FAXED TO DR. Vevelyn Royals OFFICE FOR REVIEW OF SLIGHTLY ELEVATED BUN, CREAT.

## 2011-10-06 ENCOUNTER — Ambulatory Visit (HOSPITAL_COMMUNITY): Payer: BC Managed Care – PPO | Admitting: Anesthesiology

## 2011-10-06 ENCOUNTER — Encounter (HOSPITAL_COMMUNITY)
Admission: RE | Disposition: A | Discharge status: Home / Self Care | Payer: Self-pay | Source: Ambulatory Visit | Attending: Urology

## 2011-10-06 ENCOUNTER — Observation Stay (HOSPITAL_COMMUNITY)
Admission: RE | Admit: 2011-10-06 | Discharge: 2011-10-07 | Disposition: A | Discharge status: Home / Self Care | Payer: BC Managed Care – PPO | Source: Ambulatory Visit | Attending: Urology | Admitting: Urology

## 2011-10-06 ENCOUNTER — Encounter (HOSPITAL_COMMUNITY): Payer: Self-pay

## 2011-10-06 ENCOUNTER — Encounter (HOSPITAL_COMMUNITY): Payer: Self-pay | Admitting: Anesthesiology

## 2011-10-06 DIAGNOSIS — C649 Malignant neoplasm of unspecified kidney, except renal pelvis: Secondary | ICD-10-CM | POA: Insufficient documentation

## 2011-10-06 DIAGNOSIS — Z7982 Long term (current) use of aspirin: Secondary | ICD-10-CM | POA: Insufficient documentation

## 2011-10-06 DIAGNOSIS — G4730 Sleep apnea, unspecified: Secondary | ICD-10-CM | POA: Insufficient documentation

## 2011-10-06 DIAGNOSIS — Z79899 Other long term (current) drug therapy: Secondary | ICD-10-CM | POA: Insufficient documentation

## 2011-10-06 DIAGNOSIS — Z01810 Encounter for preprocedural cardiovascular examination: Secondary | ICD-10-CM | POA: Insufficient documentation

## 2011-10-06 DIAGNOSIS — I10 Essential (primary) hypertension: Secondary | ICD-10-CM | POA: Insufficient documentation

## 2011-10-06 DIAGNOSIS — N200 Calculus of kidney: Principal | ICD-10-CM | POA: Insufficient documentation

## 2011-10-06 DIAGNOSIS — K219 Gastro-esophageal reflux disease without esophagitis: Secondary | ICD-10-CM | POA: Insufficient documentation

## 2011-10-06 DIAGNOSIS — E7800 Pure hypercholesterolemia, unspecified: Secondary | ICD-10-CM | POA: Insufficient documentation

## 2011-10-06 DIAGNOSIS — Z905 Acquired absence of kidney: Secondary | ICD-10-CM | POA: Insufficient documentation

## 2011-10-06 DIAGNOSIS — Z01812 Encounter for preprocedural laboratory examination: Secondary | ICD-10-CM | POA: Insufficient documentation

## 2011-10-06 SURGERY — CYSTOURETEROSCOPY, WITH RETROGRADE PYELOGRAM AND STENT INSERTION
Anesthesia: General | Laterality: Left | Wound class: Clean Contaminated

## 2011-10-06 MED ORDER — HYDROCHLOROTHIAZIDE 25 MG PO TABS
25.0000 mg | ORAL_TABLET | Freq: Every day | ORAL | Status: DC
  Administered 2011-10-07: 25 mg via ORAL
  Filled 2011-10-06: qty 1

## 2011-10-06 MED ORDER — LACTATED RINGERS IV SOLN: INTRAVENOUS | Status: DC

## 2011-10-06 MED ORDER — HYDROMORPHONE HCL PF 1 MG/ML IJ SOLN
0.5000 mg | INTRAMUSCULAR | Status: DC | PRN
  Administered 2011-10-06: 0.5 mg via INTRAVENOUS
  Administered 2011-10-06: 1 mg via INTRAVENOUS
  Administered 2011-10-06: 0.5 mg via INTRAVENOUS
  Filled 2011-10-06 (×2): qty 1

## 2011-10-06 MED ORDER — CIPROFLOXACIN HCL 500 MG PO TABS: 500.0000 mg | ORAL_TABLET | Freq: Two times a day (BID) | ORAL | Status: AC

## 2011-10-06 MED ORDER — IOHEXOL 300 MG/ML  SOLN
INTRAMUSCULAR | Status: AC
  Filled 2011-10-06: qty 1

## 2011-10-06 MED ORDER — SIMVASTATIN 40 MG PO TABS
40.0000 mg | ORAL_TABLET | Freq: Every morning | ORAL | Status: DC
  Administered 2011-10-07: 40 mg via ORAL
  Filled 2011-10-06: qty 1

## 2011-10-06 MED ORDER — HYDROCODONE-ACETAMINOPHEN 5-325 MG PO TABS
1.0000 | ORAL_TABLET | Freq: Four times a day (QID) | ORAL | Status: AC | PRN
Start: 2011-10-06 — End: 2011-10-16

## 2011-10-06 MED ORDER — CIPROFLOXACIN HCL 250 MG PO TABS: 250.0000 mg | ORAL_TABLET | Freq: Two times a day (BID) | ORAL | Status: AC

## 2011-10-06 MED ORDER — ZOLPIDEM TARTRATE 5 MG PO TABS
5.0000 mg | ORAL_TABLET | Freq: Once | ORAL | Status: AC
  Administered 2011-10-06: 5 mg via ORAL
  Filled 2011-10-06: qty 1

## 2011-10-06 MED ORDER — ACETAMINOPHEN 10 MG/ML IV SOLN
INTRAVENOUS | Status: AC
  Filled 2011-10-06: qty 100

## 2011-10-06 MED ORDER — CLONAZEPAM 0.5 MG PO TABS: 0.5000 mg | ORAL_TABLET | Freq: Three times a day (TID) | ORAL | Status: DC | PRN

## 2011-10-06 MED ORDER — SODIUM CHLORIDE 0.9 % IJ SOLN: 3.0000 mL | INTRAMUSCULAR | Status: DC | PRN

## 2011-10-06 MED ORDER — CIPROFLOXACIN HCL 250 MG PO TABS
250.0000 mg | ORAL_TABLET | Freq: Two times a day (BID) | ORAL | Status: DC
  Administered 2011-10-06 – 2011-10-07 (×2): 250 mg via ORAL
  Filled 2011-10-06 (×4): qty 1

## 2011-10-06 MED ORDER — LIDOCAINE HCL (CARDIAC) 20 MG/ML IV SOLN
INTRAVENOUS | Status: DC | PRN
  Administered 2011-10-06: 50 mg via INTRAVENOUS

## 2011-10-06 MED ORDER — CIPROFLOXACIN IN D5W 400 MG/200ML IV SOLN
INTRAVENOUS | Status: AC
  Filled 2011-10-06: qty 200

## 2011-10-06 MED ORDER — HYDROMORPHONE HCL PF 1 MG/ML IJ SOLN
INTRAMUSCULAR | Status: AC
  Filled 2011-10-06: qty 1

## 2011-10-06 MED ORDER — HYDROCODONE-ACETAMINOPHEN 5-325 MG PO TABS
1.0000 | ORAL_TABLET | ORAL | Status: AC | PRN
  Administered 2011-10-06 (×2): 1 via ORAL
  Filled 2011-10-06 (×2): qty 1

## 2011-10-06 MED ORDER — PROPOFOL 10 MG/ML IV BOLUS
INTRAVENOUS | Status: DC | PRN
  Administered 2011-10-06: 200 mg via INTRAVENOUS

## 2011-10-06 MED ORDER — FENTANYL CITRATE 0.05 MG/ML IJ SOLN
INTRAMUSCULAR | Status: DC | PRN
  Administered 2011-10-06 (×3): 50 ug via INTRAVENOUS

## 2011-10-06 MED ORDER — IRBESARTAN 150 MG PO TABS
150.0000 mg | ORAL_TABLET | Freq: Every day | ORAL | Status: DC
  Administered 2011-10-07: 150 mg via ORAL
  Filled 2011-10-06: qty 1

## 2011-10-06 MED ORDER — HYDROCODONE-ACETAMINOPHEN 5-325 MG PO TABS
1.0000 | ORAL_TABLET | ORAL | Status: DC | PRN
  Administered 2011-10-06 – 2011-10-07 (×2): 2 via ORAL
  Filled 2011-10-06 (×2): qty 2

## 2011-10-06 MED ORDER — VALSARTAN-HYDROCHLOROTHIAZIDE 160-25 MG PO TABS: 1.0000 | ORAL_TABLET | Freq: Every day | ORAL | Status: DC

## 2011-10-06 MED ORDER — SODIUM CHLORIDE 0.9 % IR SOLN
Status: DC | PRN
  Administered 2011-10-06: 4000 mL via INTRAVESICAL

## 2011-10-06 MED ORDER — SODIUM CHLORIDE 0.9 % IJ SOLN: 3.0000 mL | Freq: Two times a day (BID) | INTRAMUSCULAR | Status: DC

## 2011-10-06 MED ORDER — ONDANSETRON HCL 4 MG/2ML IJ SOLN
INTRAMUSCULAR | Status: DC | PRN
  Administered 2011-10-06: 4 mg via INTRAVENOUS

## 2011-10-06 MED ORDER — ACETAMINOPHEN 10 MG/ML IV SOLN
INTRAVENOUS | Status: DC | PRN
  Administered 2011-10-06: 1000 mg via INTRAVENOUS

## 2011-10-06 MED ORDER — LACTATED RINGERS IV SOLN
INTRAVENOUS | Status: DC
  Administered 2011-10-06: 1000 mL via INTRAVENOUS

## 2011-10-06 MED ORDER — IOHEXOL 300 MG/ML  SOLN
INTRAMUSCULAR | Status: DC | PRN
  Administered 2011-10-06: 10 mL

## 2011-10-06 MED ORDER — PHENAZOPYRIDINE HCL 100 MG PO TABS
100.0000 mg | ORAL_TABLET | Freq: Once | ORAL | Status: AC
  Administered 2011-10-06: 100 mg via ORAL
  Filled 2011-10-06 (×2): qty 1

## 2011-10-06 MED ORDER — ESCITALOPRAM OXALATE 10 MG PO TABS
10.0000 mg | ORAL_TABLET | Freq: Every day | ORAL | Status: DC
  Administered 2011-10-07: 10 mg via ORAL
  Filled 2011-10-06: qty 1

## 2011-10-06 MED ORDER — PANTOPRAZOLE SODIUM 40 MG PO TBEC
40.0000 mg | DELAYED_RELEASE_TABLET | Freq: Every day | ORAL | Status: DC
  Administered 2011-10-07: 40 mg via ORAL
  Filled 2011-10-06: qty 1

## 2011-10-06 MED ORDER — SODIUM CHLORIDE 0.9 % IV SOLN: 250.0000 mL | INTRAVENOUS | Status: DC | PRN

## 2011-10-06 MED ORDER — HYDROMORPHONE HCL PF 1 MG/ML IJ SOLN
0.2500 mg | INTRAMUSCULAR | Status: DC | PRN
  Administered 2011-10-06 (×2): 0.5 mg via INTRAVENOUS

## 2011-10-06 MED ORDER — PROMETHAZINE HCL 25 MG/ML IJ SOLN: 6.2500 mg | INTRAMUSCULAR | Status: DC | PRN

## 2011-10-06 MED ORDER — CIPROFLOXACIN IN D5W 400 MG/200ML IV SOLN
400.0000 mg | INTRAVENOUS | Status: AC
  Administered 2011-10-06: 400 mg via INTRAVENOUS

## 2011-10-06 MED ORDER — INDIGOTINDISULFONATE SODIUM 8 MG/ML IJ SOLN
INTRAMUSCULAR | Status: AC
  Filled 2011-10-06: qty 5

## 2011-10-06 MED ORDER — PHENAZOPYRIDINE HCL 100 MG PO TABS: 100.0000 mg | ORAL_TABLET | Freq: Three times a day (TID) | ORAL | Status: AC | PRN

## 2011-10-06 SURGICAL SUPPLY — 17 items
ADAPTER CATH URET PLST 4-6FR (CATHETERS) IMPLANT
BAG URO CATCHER STRL LF (DRAPE) ×2 IMPLANT
BASKET ZERO TIP NITINOL 2.4FR (BASKET) IMPLANT
CATH INTERMIT  6FR 70CM (CATHETERS) ×2 IMPLANT
CLOTH BEACON ORANGE TIMEOUT ST (SAFETY) ×2 IMPLANT
DRAPE CAMERA CLOSED 9X96 (DRAPES) ×2 IMPLANT
GLOVE BIOGEL M STRL SZ7.5 (GLOVE) ×2 IMPLANT
GOWN PREVENTION PLUS XLARGE (GOWN DISPOSABLE) ×2 IMPLANT
GOWN STRL NON-REIN LRG LVL3 (GOWN DISPOSABLE) ×4 IMPLANT
GUIDEWIRE ANG ZIPWIRE 038X150 (WIRE) IMPLANT
GUIDEWIRE STR DUAL SENSOR (WIRE) ×2 IMPLANT
MANIFOLD NEPTUNE II (INSTRUMENTS) ×2 IMPLANT
PACK CYSTO (CUSTOM PROCEDURE TRAY) ×2 IMPLANT
SHEATH ACCESS URETERAL 38CM (SHEATH) ×2 IMPLANT
STENT CONTOUR 6FRX24X.038 (STENTS) ×2 IMPLANT
TUBING CONNECTING 10 (TUBING) ×2 IMPLANT
WIRE COONS/BENSON .038X145CM (WIRE) IMPLANT

## 2011-10-06 NOTE — Progress Notes (Signed)
Call to Dr Laverle Patter re: patient pain level.

## 2011-10-06 NOTE — Anesthesia Preprocedure Evaluation (Addendum)
Anesthesia Evaluation    Airway Mallampati: II TM Distance: >3 FB Neck ROM: Full    Dental  (+) Teeth Intact and Dental Advisory Given   Pulmonary sleep apnea , pneumonia -, former smoker,  breath sounds clear to auscultation  Pulmonary exam normal       Cardiovascular hypertension, Rhythm:Regular Rate:Normal     Neuro/Psych  Headaches, Depression    GI/Hepatic GERD-  ,  Endo/Other    Renal/GU Renal InsufficiencyRenal diseaseSingle kidney; prior nephrectomy secondary CA     Musculoskeletal   Abdominal   Peds  Hematology   Anesthesia Other Findings   Reproductive/Obstetrics                          Anesthesia Physical Anesthesia Plan  ASA: III  Anesthesia Plan: General   Post-op Pain Management:    Induction: Intravenous  Airway Management Planned: LMA  Additional Equipment:   Intra-op Plan:   Post-operative Plan: Extubation in OR  Informed Consent: I have reviewed the patients History and Physical, chart, labs and discussed the procedure including the risks, benefits and alternatives for the proposed anesthesia with the patient or authorized representative who has indicated his/her understanding and acceptance.   Dental advisory given  Plan Discussed with: CRNA  Anesthesia Plan Comments:         Anesthesia Quick Evaluation

## 2011-10-06 NOTE — Transfer of Care (Signed)
Immediate Anesthesia Transfer of Care Note  Patient: George Jacobson  Procedure(s) Performed: Procedure(s) (LRB): CYSTOSCOPY WITH RETROGRADE PYELOGRAM, URETEROSCOPY AND STENT PLACEMENT (Left)  Patient Location: PACU  Anesthesia Type: General  Level of Consciousness: awake, alert , oriented and patient cooperative  Airway & Oxygen Therapy: Patient Spontanous Breathing and Patient connected to face mask oxygen  Post-op Assessment: Report given to PACU RN, Post -op Vital signs reviewed and stable and Patient moving all extremities X 4  Post vital signs: Reviewed and stable  Complications: No apparent anesthesia complications

## 2011-10-06 NOTE — Progress Notes (Signed)
Waiting for wife to come back in and then plan on transferring patient to room 1445. Report given to receiving  Nurse, Karie Mainland, RN.

## 2011-10-06 NOTE — Op Note (Signed)
Preoperative diagnosis:  1. Left renal calculus 2. Solitary left kidney 3. Urothelial carcinoma of the right kidney  Postoperative diagnosis:  1. Left renal calculus 2. Left renal calyceal diverticulum 3. Solitary left kidney 4. Urothelial carcinoma of the right kidney  Procedure:  1. Cystoscopy 2. Left ureteroscopy 3. Left ureteral stent placement (6 x 24) 4. Left retrograde pyelography with interpretation  Surgeon: Moody Bruins. M.D.  Anesthesia: General  Complications: None  Intraoperative findings: Left retrograde pyelography demonstrated no ureteral abnormalities. There was a stone seen in the interpolar region of the kidney. He had multiple long infundibuli throughout the kidney. Ultimately, it appeared the stone was seen to be within a diverticulum. Ureteroscopic examination did not identify the infundibular opening.  EBL: Minimal   Indication: George Jacobson  is a 61 y.o. patient with a left solitary kidney and left renal calculus. After reviewing the management options for treatment, he elected to proceed with the above surgical procedure(s). We have discussed the potential benefits and risks of the procedure, side effects of the proposed treatment, the likelihood of the patient achieving the goals of the procedure, and any potential problems that might occur during the procedure or recuperation. Informed consent has been obtained.  Description of procedure:  The patient was taken to the operating room and general anesthesia was induced.  The patient was placed in the dorsal lithotomy position, prepped and draped in the usual sterile fashion, and preoperative antibiotics were administered. A preoperative time-out was performed.   Cystourethroscopy was performed.  The patient's urethra was examined and was normal. The bladder was then systematically examined in its entirety. There was no evidence for any bladder tumors, stones, or other mucosal pathology.     Attention then turned to the left ureteral orifice and a ureteral catheter was used to intubate the ureteral orifice.  Omnipaque contrast was injected through the ureteral catheter and a retrograde pyelogram was performed with findings as dictated above.  A 0.38 sensor guidewire was then advanced up the left ureter into the renal pelvis under fluoroscopic guidance.  A 12/14 Fr ureteral access sheath was then advance over the guide wire without difficulty. The digital flexible ureteroscope was then advanced through the access sheath into the ureter next to the guidewire. The entire renal collecting system was examined and the stone was not visualized.  The CT scan was reviewed and the stone was noted to be located posteriorly within the renal parenchyma and after further direct inspection, further Omnipaque contrast was injected confirming a calyceal diverticulum with a stone within it. Since the patient is asymptomatic from his stone and the stone does not appear to put him at risk for ureteral obstruction of his solitary kidney, the procedure was stopped at this point.  The access sheath removed.  The guidewire was backloaded through the cystoscope and a ureteral stent was advance over the wire using Seldinger technique.  The stent was positioned appropriately under fluoroscopic and cystoscopic guidance.  The wire was then removed with an adequate stent curl noted in the renal pelvis as well as in the bladder.  The bladder was then emptied and the procedure ended.  The patient appeared to tolerate the procedure well and without complications.  The patient was able to be awakened and transferred to the recovery unit in satisfactory condition.   Moody Bruins MD

## 2011-10-06 NOTE — Anesthesia Postprocedure Evaluation (Signed)
Anesthesia Post Note  Patient: George Jacobson  Procedure(s) Performed: Procedure(s) (LRB): CYSTOSCOPY WITH RETROGRADE PYELOGRAM, URETEROSCOPY AND STENT PLACEMENT (Left)  Anesthesia type: General  Patient location: PACU  Post pain: Pain level controlled  Post assessment: Post-op Vital signs reviewed  Last Vitals:  Filed Vitals:   10/06/11 1015  BP:   Pulse: 58  Temp:   Resp: 15    Post vital signs: Reviewed  Level of consciousness: sedated  Complications: No apparent anesthesia complications

## 2011-10-07 NOTE — Discharge Summary (Signed)
Physician Discharge Summary  Patient ID: George Jacobson MRN: [id number] DOB/AGE: November 28, 1950 61 y.o.  Admit date: 10/06/2011 Discharge date: 10/07/2011  Admission Diagnoses:  Solitary left kidney Left renal calculus Urothelial carcinoma  Discharge Diagnoses:  Same as above  Discharged Condition: good  Hospital Course: He was admitted to the hospital after his ureteroscopy for IV pain management.  He remained hemodynamically overnight and his pain was well controlled by the following morning.   Disposition: 01-Home or Self Care   Medication List  As of 10/07/2011  7:07 AM   TAKE these medications                     ciprofloxacin 250 MG tablet   Commonly known as: CIPRO   Take 1 tablet (250 mg total) by mouth 2 (two) times daily.      clonazePAM 0.5 MG tablet   Commonly known as: KLONOPIN   Take 0.5 mg by mouth 3 (three) times daily as needed. Anxiety      escitalopram 10 MG tablet   Commonly known as: LEXAPRO   Take 10 mg by mouth daily before breakfast.      folic acid 400 MCG tablet   Commonly known as: FOLVITE   Take 800 mcg by mouth daily.                  HYDROcodone-acetaminophen 5-325 MG per tablet   Commonly known as: NORCO/VICODIN   Take 1-2 tablets by mouth every 6 (six) hours as needed for pain.      pantoprazole 40 MG tablet   Commonly known as: PROTONIX   Take 40 mg by mouth daily. IN AM      phenazopyridine 100 MG tablet   Commonly known as: PYRIDIUM   Take 1 tablet (100 mg total) by mouth 3 (three) times daily as needed for pain.      simvastatin 40 MG tablet   Commonly known as: ZOCOR   Take 40 mg by mouth every morning.      traMADol 50 MG tablet   Commonly known as: ULTRAM   Take 50 mg by mouth every 8 (eight) hours as needed. pain      valsartan-hydrochlorothiazide 160-25 MG per tablet   Commonly known as: DIOVAN-HCT   Take 1 tablet by mouth daily before breakfast.      zolpidem 5 MG tablet   Commonly known as: AMBIEN   Take 5  mg by mouth at bedtime as needed. For insomnia           Follow-up Information    Follow up with Crecencio Mc, MD. (10/19/11 at 12:30)    Contact information:   [address] [place], [address] [facility] [place] [address] [phone]          Signed: Crecencio Mc 10/07/2011, 7:07 AM

## 2011-10-07 NOTE — Progress Notes (Signed)
Patient ID: George Jacobson, male   DOB: [birth year], 61 y.o.   MRN: [id number]  1 Day Post-Op Subjective: Pt was admitted postoperatively for IV pain management.  His pain is now well controlled.  Objective: Vital signs in last 24 hours: Temp:  [97.3 F (36.3 C)-98 F (36.7 C)] 97.7 F (36.5 C) (08/23 0619) Pulse Rate:  [54-62] 57  (08/23 0619) Resp:  [9-16] 15  (08/23 0619) BP: (112-137)/(62-76) 137/76 mmHg (08/23 0619) SpO2:  [91 %-100 %] 95 % (08/23 0619) Weight:  [88.7 kg (195 lb 8.8 oz)] 88.7 kg (195 lb 8.8 oz) (08/22 1409)  Intake/Output from previous day: 08/22 0701 - 08/23 0700 In: 960 [P.O.:460; I.V.:500] Out: 2525 [Urine:2525] Intake/Output this shift:    Physical Exam:  General: Alert and oriented Abd: Minimal CVAT Ext: NT, No erythema  Lab Results:  Basename 10/05/11 1200  HGB 11.3*  HCT 32.0*   BMET  Basename 10/05/11 1200  NA 137  K 3.7  CL 99  CO2 30  GLUCOSE 92  BUN 24*  CREATININE 1.64*  CALCIUM 9.3     Studies/Results: No results found.  Assessment/Plan: -- D/C home this morning with oral pain medication   LOS: 1 day   George Jacobson,LES 10/07/2011, 7:06 AM

## 2011-10-28 ENCOUNTER — Telehealth: Payer: Self-pay | Admitting: Internal Medicine

## 2011-10-28 NOTE — Telephone Encounter (Signed)
Informed pt he may begin 81mg  ASA per Dr Rhea Belton. Encouraged him to inform us if he saw any dark stools or rectal bleeding; pt stated understanding.

## 2011-10-28 NOTE — Telephone Encounter (Signed)
I'm okay from a GI perspective on restarting 81 mg aspirin daily He should let us know if he develops any rectal bleeding or melena

## 2011-10-28 NOTE — Telephone Encounter (Signed)
Pt reports he needs to take 81 mg of ASA daily; OK to start back? Thanks.

## 2012-04-29 ENCOUNTER — Other Ambulatory Visit: Payer: Self-pay | Admitting: Internal Medicine

## 2012-10-27 ENCOUNTER — Encounter (HOSPITAL_COMMUNITY): Payer: Self-pay | Admitting: *Deleted

## 2012-10-27 ENCOUNTER — Inpatient Hospital Stay (HOSPITAL_COMMUNITY)
Admission: EM | Admit: 2012-10-27 | Discharge: 2012-10-30 | DRG: 391 | Disposition: A | Discharge status: Home / Self Care | Payer: No Typology Code available for payment source | Attending: Internal Medicine | Admitting: Internal Medicine

## 2012-10-27 DIAGNOSIS — Z87891 Personal history of nicotine dependence: Secondary | ICD-10-CM

## 2012-10-27 DIAGNOSIS — F3289 Other specified depressive episodes: Secondary | ICD-10-CM | POA: Diagnosis present

## 2012-10-27 DIAGNOSIS — K5520 Angiodysplasia of colon without hemorrhage: Secondary | ICD-10-CM

## 2012-10-27 DIAGNOSIS — D500 Iron deficiency anemia secondary to blood loss (chronic): Secondary | ICD-10-CM

## 2012-10-27 DIAGNOSIS — Z860101 Personal history of adenomatous and serrated colon polyps: Secondary | ICD-10-CM

## 2012-10-27 DIAGNOSIS — K5730 Diverticulosis of large intestine without perforation or abscess without bleeding: Secondary | ICD-10-CM | POA: Diagnosis present

## 2012-10-27 DIAGNOSIS — K219 Gastro-esophageal reflux disease without esophagitis: Secondary | ICD-10-CM | POA: Diagnosis present

## 2012-10-27 DIAGNOSIS — D649 Anemia, unspecified: Secondary | ICD-10-CM

## 2012-10-27 DIAGNOSIS — Z8559 Personal history of malignant neoplasm of other urinary tract organ: Secondary | ICD-10-CM

## 2012-10-27 DIAGNOSIS — Z79899 Other long term (current) drug therapy: Secondary | ICD-10-CM

## 2012-10-27 DIAGNOSIS — Z860100 Personal history of colon polyps, unspecified: Secondary | ICD-10-CM

## 2012-10-27 DIAGNOSIS — C689 Malignant neoplasm of urinary organ, unspecified: Secondary | ICD-10-CM

## 2012-10-27 DIAGNOSIS — I129 Hypertensive chronic kidney disease with stage 1 through stage 4 chronic kidney disease, or unspecified chronic kidney disease: Secondary | ICD-10-CM | POA: Diagnosis present

## 2012-10-27 DIAGNOSIS — Z85528 Personal history of other malignant neoplasm of kidney: Secondary | ICD-10-CM

## 2012-10-27 DIAGNOSIS — R531 Weakness: Secondary | ICD-10-CM

## 2012-10-27 DIAGNOSIS — Z791 Long term (current) use of non-steroidal anti-inflammatories (NSAID): Secondary | ICD-10-CM

## 2012-10-27 DIAGNOSIS — K648 Other hemorrhoids: Secondary | ICD-10-CM | POA: Diagnosis present

## 2012-10-27 DIAGNOSIS — R195 Other fecal abnormalities: Secondary | ICD-10-CM

## 2012-10-27 DIAGNOSIS — Z905 Acquired absence of kidney: Secondary | ICD-10-CM

## 2012-10-27 DIAGNOSIS — N289 Disorder of kidney and ureter, unspecified: Secondary | ICD-10-CM

## 2012-10-27 DIAGNOSIS — F329 Major depressive disorder, single episode, unspecified: Secondary | ICD-10-CM | POA: Diagnosis present

## 2012-10-27 DIAGNOSIS — K31819 Angiodysplasia of stomach and duodenum without bleeding: Principal | ICD-10-CM

## 2012-10-27 DIAGNOSIS — K2971 Gastritis, unspecified, with bleeding: Secondary | ICD-10-CM

## 2012-10-27 DIAGNOSIS — G4730 Sleep apnea, unspecified: Secondary | ICD-10-CM | POA: Diagnosis present

## 2012-10-27 DIAGNOSIS — N189 Chronic kidney disease, unspecified: Secondary | ICD-10-CM | POA: Diagnosis present

## 2012-10-27 DIAGNOSIS — D126 Benign neoplasm of colon, unspecified: Secondary | ICD-10-CM

## 2012-10-27 DIAGNOSIS — K922 Gastrointestinal hemorrhage, unspecified: Secondary | ICD-10-CM

## 2012-10-27 DIAGNOSIS — F411 Generalized anxiety disorder: Secondary | ICD-10-CM | POA: Diagnosis present

## 2012-10-27 DIAGNOSIS — R42 Dizziness and giddiness: Secondary | ICD-10-CM

## 2012-10-27 DIAGNOSIS — D509 Iron deficiency anemia, unspecified: Secondary | ICD-10-CM

## 2012-10-27 DIAGNOSIS — I10 Essential (primary) hypertension: Secondary | ICD-10-CM

## 2012-10-27 LAB — PREPARE RBC (CROSSMATCH)

## 2012-10-27 LAB — CBC
HCT: 22.1 % — ABNORMAL LOW (ref 39.0–52.0)
Hemoglobin: 6.9 g/dL — CL (ref 13.0–17.0)
MCH: 22.8 pg — ABNORMAL LOW (ref 26.0–34.0)
MCHC: 31.2 g/dL (ref 30.0–36.0)
MCV: 73.2 fL — ABNORMAL LOW (ref 78.0–100.0)
Platelets: 197 10*3/uL (ref 150–400)
RBC: 3.02 MIL/uL — ABNORMAL LOW (ref 4.22–5.81)
RDW: 14.3 % (ref 11.5–15.5)
WBC: 4 10*3/uL (ref 4.0–10.5)

## 2012-10-27 LAB — BASIC METABOLIC PANEL
BUN: 20 mg/dL (ref 6–23)
CO2: 29 mEq/L (ref 19–32)
Calcium: 8.7 mg/dL (ref 8.4–10.5)
Chloride: 100 mEq/L (ref 96–112)
Creatinine, Ser: 1.54 mg/dL — ABNORMAL HIGH (ref 0.50–1.35)
GFR calc Af Amer: 53 mL/min — ABNORMAL LOW (ref >90)
GFR calc non Af Amer: 46 mL/min — ABNORMAL LOW (ref >90)
Glucose, Bld: 98 mg/dL (ref 70–99)
Potassium: 4.1 mEq/L (ref 3.5–5.1)
Sodium: 136 mEq/L (ref 135–145)

## 2012-10-27 MED ORDER — CLONAZEPAM 0.5 MG PO TABS
0.5000 mg | ORAL_TABLET | Freq: Three times a day (TID) | ORAL | Status: DC | PRN
  Administered 2012-10-29: 0.5 mg via ORAL
  Filled 2012-10-27: qty 1

## 2012-10-27 MED ORDER — SODIUM CHLORIDE 0.9 % IV SOLN: INTRAVENOUS | Status: DC

## 2012-10-27 MED ORDER — ACETAMINOPHEN 650 MG RE SUPP: 650.0000 mg | Freq: Four times a day (QID) | RECTAL | Status: DC | PRN

## 2012-10-27 MED ORDER — SIMVASTATIN 40 MG PO TABS
40.0000 mg | ORAL_TABLET | Freq: Every morning | ORAL | Status: DC
  Administered 2012-10-28 – 2012-10-30 (×3): 40 mg via ORAL
  Filled 2012-10-27 (×3): qty 1

## 2012-10-27 MED ORDER — ACETAMINOPHEN 325 MG PO TABS: 650.0000 mg | ORAL_TABLET | Freq: Four times a day (QID) | ORAL | Status: DC | PRN

## 2012-10-27 MED ORDER — PANTOPRAZOLE SODIUM 40 MG IV SOLR
40.0000 mg | Freq: Two times a day (BID) | INTRAVENOUS | Status: DC
  Administered 2012-10-27 – 2012-10-30 (×6): 40 mg via INTRAVENOUS
  Filled 2012-10-27 (×7): qty 40

## 2012-10-27 MED ORDER — SODIUM CHLORIDE 0.9 % IJ SOLN
3.0000 mL | Freq: Two times a day (BID) | INTRAMUSCULAR | Status: DC
  Administered 2012-10-27 – 2012-10-30 (×4): 3 mL via INTRAVENOUS

## 2012-10-27 MED ORDER — SODIUM CHLORIDE 0.9 % IV SOLN
INTRAVENOUS | Status: DC
  Administered 2012-10-28 – 2012-10-29 (×4): via INTRAVENOUS

## 2012-10-27 MED ORDER — METHOCARBAMOL 500 MG PO TABS
500.0000 mg | ORAL_TABLET | Freq: Three times a day (TID) | ORAL | Status: DC | PRN
Start: 2012-10-27 — End: 2012-10-30
  Administered 2012-10-28: 500 mg via ORAL
  Filled 2012-10-27: qty 1

## 2012-10-27 MED ORDER — HYDROCODONE-ACETAMINOPHEN 5-325 MG PO TABS
1.0000 | ORAL_TABLET | Freq: Four times a day (QID) | ORAL | Status: DC | PRN
  Administered 2012-10-28 – 2012-10-30 (×6): 1 via ORAL
  Filled 2012-10-27 (×6): qty 1

## 2012-10-27 MED ORDER — FOLIC ACID 1 MG PO TABS
1.0000 mg | ORAL_TABLET | Freq: Every day | ORAL | Status: DC
  Administered 2012-10-27 – 2012-10-30 (×4): 1 mg via ORAL
  Filled 2012-10-27 (×4): qty 1

## 2012-10-27 MED ORDER — ESCITALOPRAM OXALATE 10 MG PO TABS
10.0000 mg | ORAL_TABLET | Freq: Every day | ORAL | Status: DC
  Administered 2012-10-29 – 2012-10-30 (×2): 10 mg via ORAL
  Filled 2012-10-27 (×3): qty 1

## 2012-10-27 NOTE — ED Notes (Signed)
Per pt, he has gastric antral vascular ectasia.  Pt's nephrologist, Dr. Greggory Stallion, sent pt to ED for blood transfusion; Hgb 7.1, collected on 10/24/12

## 2012-10-27 NOTE — H&P (Signed)
[facility] History and Physical  [name]:[id number] DOB: 1951-01-04 DOA: 10/27/2012  Referring physician: Dr. Denton Lank PCP: Gaspar Skeeters, MD  Specialists: GI, Dr. Marina Goodell  Chief Complaint: anemia  HPI: George Jacobson is a 62 y.o. 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 Rhea Belton as an outpatient presents today to the emergency room at the indication of his nephrologist after regular blood work showed that he has a hemoglobin of 7.2 in the office couple days ago. Patient had a hospitalization last year for a GI bleed and subsequent anemia. Over the past week, patient has been feeling more fatigued than normal as noted that he is more pale as well. He denies any chest pain, denies any shortness of breath. He endorses mild nausea and occasional lightheadedness. He denies any frank bright red blood per rectum or dark tarry stools. Patient has his CBC checked regularly and Hemoglobin has been stable until this week. He used to take iron supplements but has not taken any this year. In the ED he was found to have a Hb of 6.9 and hospitalist service was asked for admission.   Review of Systems: as per HPI otherwise negative.   Past Medical History  Diagnosis Date  . 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 Hypertension   . Cancer 10/2010    right kidney cancer  . Blood transfusion   . GERD (gastroesophageal reflux disease)   . Headache(784.0)   . Arthritis     generalized  . Depression   . Pneumonia     DEC 2012 HOSP AT [facility] WITH PNEUMONIA  . Sleep apnea     TEST WAS YRS AGO-DID USE CPAP FOR A WHILE--BUT NO LONGER USES--AND DOES NOT FEEL SLEEPY DURING THE DAY TIME ANYMORE  . 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  [name] Anemia    DEC 2012 -REQUIRED TRANSFUSIONS AND ANEMIA AND TRANSFUSIONS AGAIN FEB 2013--DX WITH "GAVE"   Past Surgical History  Procedure Laterality Date  . Laparoscopic nephrectomy  10/2010    total   . Foot surgery  2000    both foot sx for bunion  . Varicose vein surgery  1986  . Inner ear surgery  1984    right  ear  . Hernia repair  1954    left   . Esophagogastroduodenoscopy  03/23/2011    Procedure: ESOPHAGOGASTRODUODENOSCOPY (EGD);  Surgeon: Erick Blinks, MD;  Location: [facility];  Service: Gastroenterology;  Laterality: N/A;  . Givens capsule study  03/24/2011    Procedure: GIVENS CAPSULE STUDY;  Surgeon: Erick Blinks, MD;  Location: [facility];  Service: Gastroenterology;  Laterality: N/A;   Social History:  reports that he quit smoking about 14 years ago. His smoking use included Cigarettes. He has a 25 pack-year smoking history. He has never used smokeless tobacco. He reports that  drinks alcohol. He reports that he does not use illicit drugs.  Allergies  Allergen Reactions  . Sulfa Antibiotics     Headaches  . Tetracyclines & Related     Nausea    Family History  Problem Relation Age of Onset  . Hypertension Father   . Breast cancer Sister    Prior to Admission medications   Medication Sig Start Date End Date Taking? Authorizing  Provider  baclofen (LIORESAL) 10 MG tablet  09/03/12   Historical Provider, MD  clonazePAM (KLONOPIN) 0.5 MG tablet Take 0.5 mg by mouth 3 (three) times daily as needed. Anxiety    Historical Provider, MD  escitalopram (LEXAPRO) 10 MG tablet Take 10 mg by mouth daily before breakfast.    Historical Provider, MD  folic acid (FOLVITE) 400 MCG tablet Take 800 mcg by mouth daily.    Historical Provider, MD  HYDROcodone-acetaminophen (VICODIN) 5-500 MG per tablet Take 1 tablet by mouth every 8 (eight) hours as needed. For pain    Historical Provider, MD  losartan (COZAAR) 50 MG tablet  10/25/12   Historical Provider, MD  methocarbamol (ROBAXIN) 500 MG tablet   10/24/12   Historical Provider, MD  pantoprazole (PROTONIX) 40 MG tablet Take 40 mg by mouth daily. IN AM 04/15/11 04/14/12  Beverley Fiedler, MD  pantoprazole (PROTONIX) 40 MG tablet TAKE 1 TABLET BY MOUTH EVERY DAY 04/29/12   Beverley Fiedler, MD  simvastatin (ZOCOR) 40 MG tablet Take 40 mg by mouth every morning.     Historical Provider, MD  tamsulosin (FLOMAX) 0.4 MG CAPS capsule  10/17/12   Historical Provider, MD  traMADol (ULTRAM) 50 MG tablet Take 50 mg by mouth every 8 (eight) hours as needed. pain    Historical Provider, MD  valsartan-hydrochlorothiazide (DIOVAN-HCT) 160-25 MG per tablet Take 1 tablet by mouth daily before breakfast.     Historical Provider, MD  zolpidem (AMBIEN) 5 MG tablet Take 5 mg by mouth at bedtime as needed. For insomnia    Historical Provider, MD   Physical Exam: Filed Vitals:   10/27/12 1637 10/27/12 1854 10/27/12 1900 10/27/12 1918  BP: 120/65 122/71  112/60  Pulse: 66 56 57 58  Temp: 97.9 F (36.6 C) 97.9 F (36.6 C)  98.2 F (36.8 C)  TempSrc: Oral     Resp: 14 12 16 13   SpO2: 99% 100% 100%      General:  No apparent distress, appears pale  Eyes: PERRL, EOMI, no scleral icterus  ENT: moist oropharynx  Neck: supple, no JVD  Cardiovascular: regular rate without MRG; 2+ peripheral pulses  Respiratory: CTA biL, good air movement without wheezing, rhonchi or crackled  Abdomen: soft, non tender to palpation, positive bowel sounds, no guarding, no rebound  Skin: no rashes  Musculoskeletal: no peripheral edema  Psychiatric: normal mood and affect  Neurologic: non focal  Labs on Admission:  Basic Metabolic Panel:  Recent Labs Lab 10/27/12 1720  NA 136  K 4.1  CL 100  CO2 29  GLUCOSE 98  BUN 20  CREATININE 1.54*  CALCIUM 8.7   CBC:  Recent Labs Lab 10/27/12 1720  WBC 4.0  HGB 6.9*  HCT 22.1*  MCV 73.2*  PLT 197    Assessment/Plan Active Problems:   Renal insufficiency   Gastritis with bleeding   Small bowel arteriovenous  malformation   GI bleed    Symptomatic anemia - Most likely due to probable GI bleed. Gastroenterology to see patient in the morning, and I talked the case with Dr. Marina Goodell over the phone. Transfuse 2 units of packed RBCs tonight. - Repeat iron studies - IV PPI Chronic kidney disease - creatinine 1.5, baseline 1.5-1.6. Stable. Monitor. HTN - hold BP meds DVT Prophylaxis - SCDs  Code Status: Full  Family Communication: wife bedside  Disposition Plan: inpatient  Time spent: 51  Costin M. Elvera Lennox, MD [facility] Pager [phone]  If 7PM-7AM, please contact night-coverage  [email or web address] Password [email or web address] 10/27/2012, 8:16 PM

## 2012-10-27 NOTE — ED Provider Notes (Signed)
CSN: [id number]     Arrival date & time 10/27/12  1622 History   First MD Initiated Contact with Patient 10/27/12 1649     Chief Complaint  Patient presents with  . Anemia   (Consider location/radiation/quality/duration/timing/severity/associated sxs/prior Treatment) Patient is a 62 y.o. male presenting with anemia. The history is provided by the patient and the spouse.  Anemia Pertinent negatives include no chest pain, no abdominal pain, no headaches and no shortness of breath.  pt with hx anemia, presents indicating in past few weeks felt generally weak, lightheaded when stands, and had lab work from pcp 3 days ago. States was called today and told to go to er for transfusion. Pt states last transfusion was a couple yrs ago.  Notes hx iron deficiency, single kidney due to prior nephrectomy for urothelial ca, and also hx GAVE syndrome.  Pt denies any recent blood loss, rectal bleeding or melena. No anticoag use. No current or recent cp or discomfort. No sob. States had been on iron in past but not for past year.     Past Medical History  Diagnosis Date  . 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 Hypertension   . Cancer 10/2010    right kidney cancer  . Blood transfusion   . GERD (gastroesophageal reflux disease)   . Headache(784.0)   . Arthritis     generalized  . Depression   . Pneumonia     DEC 2012 HOSP AT [facility] WITH PNEUMONIA  . Sleep apnea     TEST WAS YRS AGO-DID USE CPAP FOR A WHILE--BUT NO LONGER USES--AND DOES NOT FEEL SLEEPY DURING THE DAY TIME ANYMORE  . 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  [name] Anemia     DEC 2012 -REQUIRED TRANSFUSIONS AND ANEMIA AND TRANSFUSIONS AGAIN FEB 2013--DX WITH "GAVE"   Past Surgical History  Procedure Laterality Date  . Laparoscopic nephrectomy  10/2010    total   . Foot surgery   2000    both foot sx for bunion  . Varicose vein surgery  1986  . Inner ear surgery  1984    right  ear  . Hernia repair  1954    left   . Esophagogastroduodenoscopy  03/23/2011    Procedure: ESOPHAGOGASTRODUODENOSCOPY (EGD);  Surgeon: Erick Blinks, MD;  Location: [facility];  Service: Gastroenterology;  Laterality: N/A;  . Givens capsule study  03/24/2011    Procedure: GIVENS CAPSULE STUDY;  Surgeon: Erick Blinks, MD;  Location: [facility];  Service: Gastroenterology;  Laterality: N/A;   Family History  Problem Relation Age of Onset  . Hypertension Father   . Breast cancer Sister    History  Substance Use Topics  . Smoking status: Former Smoker -- 1.00 packs/day for 25 years    Types: Cigarettes    Quit date: 02/14/1998  . Smokeless tobacco: Never Used  . Alcohol Use: Yes     Comment: 2 nights a week, not much though    Review of Systems  Constitutional: Negative for fever.  HENT: Negative for neck pain.   Eyes: Negative for redness.  Respiratory: Negative for shortness of breath.   Cardiovascular: Negative for chest pain.  Gastrointestinal: Negative for abdominal pain and blood in stool.  Genitourinary: Negative for flank pain.  Musculoskeletal: Negative for back pain.  Skin: Negative for rash.  Neurological: Positive for light-headedness. Negative for headaches.  Hematological: Does not bruise/bleed easily.  Psychiatric/Behavioral: Negative for confusion.    Allergies  Sulfa antibiotics and Tetracyclines & related  Home Medications   Current Outpatient Rx  Name  Route  Sig  Dispense  Refill  . baclofen (LIORESAL) 10 MG tablet               . clonazePAM (KLONOPIN) 0.5 MG tablet   Oral   Take 0.5 mg by mouth 3 (three) times daily as needed. Anxiety         . escitalopram (LEXAPRO) 10 MG tablet   Oral   Take 10 mg by mouth daily before breakfast.         . folic acid (FOLVITE) 400 MCG tablet   Oral   Take 800 mcg by mouth daily.         [name]  HYDROcodone-acetaminophen (VICODIN) 5-500 MG per tablet   Oral   Take 1 tablet by mouth every 8 (eight) hours as needed. For pain         . losartan (COZAAR) 50 MG tablet               . methocarbamol (ROBAXIN) 500 MG tablet               . EXPIRED: pantoprazole (PROTONIX) 40 MG tablet   Oral   Take 40 mg by mouth daily. IN AM         . pantoprazole (PROTONIX) 40 MG tablet      TAKE 1 TABLET BY MOUTH EVERY DAY   30 tablet   11   . simvastatin (ZOCOR) 40 MG tablet   Oral   Take 40 mg by mouth every morning.          . tamsulosin (FLOMAX) 0.4 MG CAPS capsule               . traMADol (ULTRAM) 50 MG tablet   Oral   Take 50 mg by mouth every 8 (eight) hours as needed. pain         . valsartan-hydrochlorothiazide (DIOVAN-HCT) 160-25 MG per tablet   Oral   Take 1 tablet by mouth daily before breakfast.          . zolpidem (AMBIEN) 5 MG tablet   Oral   Take 5 mg by mouth at bedtime as needed. For insomnia          BP 120/65  Pulse 66  Temp(Src) 97.9 F (36.6 C) (Oral)  Resp 14  SpO2 99% Physical Exam  Nursing note and vitals reviewed. Constitutional: He is oriented to person, place, and time. He appears well-developed and well-nourished. No distress.  HENT:  Head: Atraumatic.  Eyes:  Conj pale  Neck: Neck supple. No tracheal deviation present.  Cardiovascular: Normal rate, regular rhythm, normal heart sounds and intact distal pulses.   Pulmonary/Chest: Effort normal and breath sounds normal. No accessory muscle usage. No respiratory distress.  Abdominal: Soft. Bowel sounds are normal. He exhibits no distension and no mass. There is no tenderness. There is no rebound and no guarding.  Genitourinary:  Rectal light brown stool.   Musculoskeletal: Normal range of motion. He exhibits no edema.  Neurological: He is alert and oriented to person, place, and time.  Skin: Skin is warm and dry. He is not diaphoretic.  Psychiatric: He has a normal mood  and affect.    ED Course  Procedures (including critical care time)  Results for orders  placed during the hospital encounter of 10/27/12  CBC      Result Value Range   WBC 4.0  4.0 - 10.5 K/uL   RBC 3.02 (*) 4.22 - 5.81 MIL/uL   Hemoglobin 6.9 (*) 13.0 - 17.0 g/dL   HCT 40.3 (*) 47.4 - 25.9 %   MCV 73.2 (*) 78.0 - 100.0 fL   MCH 22.8 (*) 26.0 - 34.0 pg   MCHC 31.2  30.0 - 36.0 g/dL   RDW 56.3  87.5 - 64.3 %   Platelets 197  150 - 400 K/uL  BASIC METABOLIC PANEL      Result Value Range   Sodium 136  135 - 145 mEq/L   Potassium 4.1  3.5 - 5.1 mEq/L   Chloride 100  96 - 112 mEq/L   CO2 29  19 - 32 mEq/L   Glucose, Bld 98  70 - 99 mg/dL   BUN 20  6 - 23 mg/dL   Creatinine, Ser 3.29 (*) 0.50 - 1.35 mg/dL   Calcium 8.7  8.4 - 51.8 mg/dL   GFR calc non Af Amer 46 (*) >90 mL/min   GFR calc Af Amer 53 (*) >90 mL/min  TYPE AND SCREEN      Result Value Range   ABO/RH(D) O POS     Antibody Screen NEG     Sample Expiration 10/30/2012     Unit Number [id number]     Blood Component Type RED CELLS,LR     Unit division 00     Status of Unit ALLOCATED     Transfusion Status OK TO TRANSFUSE     Crossmatch Result Compatible     Unit Number [id number]     Blood Component Type RED CELLS,LR     Unit division 00     Status of Unit ALLOCATED     Transfusion Status OK TO TRANSFUSE     Crossmatch Result Compatible    PREPARE RBC (CROSSMATCH)      Result Value Range   Order Confirmation ORDER PROCESSED BY BLOOD BANK      MDM  Iv ns. Labs.  Reviewed labs from pcp 3 days ago, hgb 7, dec from prior, pt symptomatic, requesting transfusion.  2 units prbc.   Reviewed nursing notes and prior charts for additional history.   Hemoccult sent to mini lab.  [facility]iscussed w [facility] - will admit to obs, tele, team 8.     Suzi Roots, MD 10/27/12 [phone]

## 2012-10-27 NOTE — ED Notes (Signed)
Critical lab value- hgb 6.9

## 2012-10-28 DIAGNOSIS — K31819 Angiodysplasia of stomach and duodenum without bleeding: Principal | ICD-10-CM

## 2012-10-28 DIAGNOSIS — Z86010 Personal history of colonic polyps: Secondary | ICD-10-CM

## 2012-10-28 DIAGNOSIS — D509 Iron deficiency anemia, unspecified: Secondary | ICD-10-CM

## 2012-10-28 DIAGNOSIS — K5520 Angiodysplasia of colon without hemorrhage: Secondary | ICD-10-CM

## 2012-10-28 DIAGNOSIS — D500 Iron deficiency anemia secondary to blood loss (chronic): Secondary | ICD-10-CM

## 2012-10-28 LAB — OCCULT BLOOD X 1 CARD TO LAB, STOOL: Fecal Occult Bld: NEGATIVE

## 2012-10-28 LAB — IRON AND TIBC
Iron: 159 ug/dL — ABNORMAL HIGH (ref 42–135)
Saturation Ratios: 48 % (ref 20–55)
TIBC: 329 ug/dL (ref 215–435)
UIBC: 170 ug/dL (ref 125–400)

## 2012-10-28 LAB — FERRITIN: Ferritin: 6 ng/mL — ABNORMAL LOW (ref 22–322)

## 2012-10-28 LAB — HEPATIC FUNCTION PANEL
ALT: 8 U/L (ref 0–53)
AST: 12 U/L (ref 0–37)
Albumin: 3.4 g/dL — ABNORMAL LOW (ref 3.5–5.2)
Alkaline Phosphatase: 39 U/L (ref 39–117)
Bilirubin, Direct: <0.1 mg/dL (ref 0.0–0.3)
Total Bilirubin: 0.5 mg/dL (ref 0.3–1.2)
Total Protein: 6.1 g/dL (ref 6.0–8.3)

## 2012-10-28 MED ORDER — PEG-KCL-NACL-NASULF-NA ASC-C 100 G PO SOLR: 1.0000 | Freq: Once | ORAL | Status: DC

## 2012-10-28 MED ORDER — PEG-KCL-NACL-NASULF-NA ASC-C 100 G PO SOLR
0.5000 | Freq: Once | ORAL | Status: AC
  Administered 2012-10-29: 100 g via ORAL

## 2012-10-28 MED ORDER — MORPHINE SULFATE 2 MG/ML IJ SOLN
2.0000 mg | INTRAMUSCULAR | Status: DC | PRN
  Administered 2012-10-28 – 2012-10-29 (×2): 2 mg via INTRAVENOUS
  Filled 2012-10-28 (×2): qty 1

## 2012-10-28 MED ORDER — PEG-KCL-NACL-NASULF-NA ASC-C 100 G PO SOLR
0.5000 | Freq: Once | ORAL | Status: AC
  Administered 2012-10-28: 100 g via ORAL
  Filled 2012-10-28: qty 1

## 2012-10-28 MED ORDER — FERROUS SULFATE 325 (65 FE) MG PO TABS
325.0000 mg | ORAL_TABLET | Freq: Three times a day (TID) | ORAL | Status: DC
  Administered 2012-10-29 – 2012-10-30 (×3): 325 mg via ORAL
  Filled 2012-10-28 (×7): qty 1

## 2012-10-28 NOTE — Progress Notes (Signed)
[facility] PROGRESS NOTE  George Jacobson ZOX:[id number] DOB: [birth year] DOA: 10/27/2012 PCP: Gaspar Skeeters, MD  Assessment/Plan: George Jacobson -2/2 Jacobson blood loss from presumed George. -Appreciate George input. -For EGD/colonoscopy in am.  Jacobson Blood Loss George/Iron Deficiency George -Has been transfused 2 units of PRBCs. -Will start ferrous sulfate given ferritin of 6. -Recheck CBC in am.  Code Status: Full COde Family Communication: Patient only  Disposition Plan: Home when work up complete   Consultants:  George    Antibiotics:  None   Subjective: No complaints  Objective: Filed Vitals:   10/28/12 0052 10/28/12 0152 10/28/12 0522 10/28/12 1438  BP: 106/66 124/76 123/75 108/63  Pulse: 54 53 53 60  Temp: 97.3 F (36.3 C) 97.7 F (36.5 C) 97.7 F (36.5 C) 98.5 F (36.9 C)  TempSrc: Oral Oral Oral Oral  Resp: 12 14 16 18   Height:      Weight:      SpO2:   99% 98%    Intake/Output Summary (Last 24 hours) at 10/28/12 1752 Last data filed at 10/28/12 1500  Gross per 24 hour  Intake 2146.67 ml  Output   1250 ml  Net 896.67 ml   Filed Weights   10/27/12 2020  Weight: 73.301 kg (161 lb 9.6 oz)    Exam:   General:  AA Ox3  Cardiovascular: RRR  Respiratory: CTA B  Abdomen: S/NT/ND/+BS  Extremities: no C/C/E   Neurologic:  Non-focal  Data Reviewed: Basic Metabolic Panel:  Recent Labs Lab 10/27/12 1720  NA 136  K 4.1  CL 100  CO2 29  GLUCOSE 98  BUN 20  CREATININE 1.54*  CALCIUM 8.7   Liver Function Tests:  Recent Labs Lab 10/28/12 0510  AST 12  ALT 8  ALKPHOS 39  BILITOT 0.5  PROT 6.1  ALBUMIN 3.4*   No results found for this basename: LIPASE, AMYLASE,  in the last 168 hours No results found for this basename: AMMONIA,  in the last 168 hours CBC:  Recent Labs Lab 10/27/12 1720  WBC 4.0  HGB 6.9*  HCT 22.1*  MCV 73.2*  PLT 197   Cardiac Enzymes: No results found for this basename: CKTOTAL, CKMB, CKMBINDEX,  TROPONINI,  in the last 168 hours BNP (last 3 results) No results found for this basename: PROBNP,  in the last 8760 hours CBG: No results found for this basename: GLUCAP,  in the last 168 hours  No results found for this or any previous visit (from the past 240 hour(s)).   Studies: No results found.  Scheduled Meds: . escitalopram  10 mg Oral Daily  . folic acid  1 mg Oral Daily  . pantoprazole (PROTONIX) IV  40 mg Intravenous Q12H  . peg 3350 powder  0.5 kit Oral Once   And  . [START ON 10/29/2012] peg 3350 powder  0.5 kit Oral Once  . simvastatin  40 mg Oral q morning - 10a  . sodium chloride  3 mL Intravenous Q12H   Continuous Infusions: . sodium chloride    . sodium chloride 100 mL/hr at 10/28/12 1215    Principal Problem:   George Jacobson Active Problems:   George (gastric antral vascular ectasia)   George Jacobson   George Jacobson   George Jacobson   George Jacobson    Time spent: 35 minutes    HERNANDEZ Jacobson,George  [facility] Pager [phone]  If 7PM-7AM, please contact night-coverage at [email or web address], password [facility] 10/28/2012, 5:52  PM  LOS: 1 day

## 2012-10-28 NOTE — Consult Note (Signed)
Referring Provider: [facility] Primary Care Physician:  STAMBAUGH,MERRIS, MD Primary Gastroenterologist:  Dr.Pyrtle  Reason for Consultation:  Microcytic anemia  HPI: George Jacobson is a 62 y.o. male admitted last evening through the emergency room after he presented with complaints of progressive fatigue. Patient had labs done last week as an outpatient which showed a hemoglobin of 7.2. Labs in the ER showed hemoglobin of 6.9 with an MCV of 73. 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 He is known to Dr. Pyrtle from evaluation done in 2013 at which time he had presented with profound anemia and iron deficiency He underwent upper endoscopy in February of 2013 which showed gastritis and duodenitis, and concern for possible gave syndrome. Biopsies were done showing chronic gastritis H. pylori. He also underwent capsule endoscopy at that time which was negative with the exception of one very small distal AVM. Patient has history of adenomatous colon polyps-had undergone previous colonoscopies in [place] The last was done in 2010 and was apparently negative. Decision was made to do followup colonoscopy in 2015.   patient states he had done well for over a year. We have documented a hemoglobin of 11.8 in August of 2013. He says he did CBCs done every 6 months by his urologist and had hemoglobin 11.7 in the winter of 2014. He has not been on iron recently. He  has not noticed any melena or hematochezia. He has no complaints of abdominal pain, indigestion, nausea etc. He has noticed increased fatigue over the past 2 months ,and  says very recently has had some vague nausea. He was started back on a baby aspirin about a year ago no other NSAIDs.. Patient brought his outpatient labs with him an iron studies were also done on 10/24/2012 showing an iron of 28 and sparing of 8 and ferritin of  6   Past Medical History  Diagnosis Date  . 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 Hypertension   . Cancer 10/2010    right kidney cancer  . Blood transfusion   . GERD (gastroesophageal reflux disease)   . Headache(784.0)   . Arthritis     generalized  . Depression   . Pneumonia     DEC 2012 HOSP AT [facility] WITH PNEUMONIA  . Sleep apnea     TEST WAS YRS AGO-DID USE CPAP FOR A WHILE--BUT NO LONGER USES--AND DOES NOT FEEL SLEEPY DURING THE DAY TIME ANYMORE  . 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  . Anemia     DEC 2012 -REQUIRED TRANSFUSIONS AND ANEMIA AND TRANSFUSIONS AGAIN FEB 2013--DX WITH "GAVE"    Past Surgical History  Procedure Laterality Date  . Laparoscopic nephrectomy  10/2010    total   . Foot surgery  2000    both foot sx for bunion  . Varicose vein surgery  1986  . Inner ear surgery  1984    right  ear  . Hernia repair  1954    left   . Esophagogastroduodenoscopy  03/23/2011    Procedure: ESOPHAGOGASTRODUODENOSCOPY (EGD);  Surgeon: Jay Pyrtle, MD;  Location: [facility];  Service: Gastroenterology;  Laterality: N/A;  . Givens capsule study  03/24/2011    Procedure: GIVENS CAPSULE STUDY;  Surgeon: Jay   Pyrtle, MD;  Location: [facility]OS[facility];  Service: Gastroenterology;  Laterality: N/A;    Prior to Admission medications   Medication Sig Start Date End Date Taking? Authorizing Provider  aspirin EC 81 MG tablet Take 81 mg by mouth daily.   Yes Historical Provider, MD  clonazePAM (KLONOPIN) 0.5 MG tablet Take 0.5 mg by mouth 3 (three) times daily as needed for anxiety.   Yes Historical Provider, MD  docusate sodium (COLACE) 100 MG capsule Take 100 mg by mouth 2 (two) times daily as needed for constipation.   Yes Historical Provider, MD  escitalopram (LEXAPRO) 10 MG tablet Take 10 mg by mouth daily before  breakfast.   Yes Historical Provider, MD  folic acid (FOLVITE) 400 MCG tablet Take 800 mcg by mouth daily.   Yes Historical Provider, MD  HYDROcodone-acetaminophen (NORCO) 7.5-325 MG per tablet Take 1 tablet by mouth every 4 (four) hours as needed for pain.   Yes Historical Provider, MD  losartan (COZAAR) 50 MG tablet Take 50 mg by mouth daily.   Yes Historical Provider, MD  methocarbamol (ROBAXIN) 500 MG tablet Take 500 mg by mouth 3 (three) times daily.   Yes Historical Provider, MD  Omega-3 Fatty Acids (FISH OIL) 1200 MG CAPS Take 1,200 mg by mouth daily.   Yes Historical Provider, MD  pantoprazole (PROTONIX) 40 MG tablet Take 40 mg by mouth daily.   Yes Historical Provider, MD  simvastatin (ZOCOR) 40 MG tablet Take 40 mg by mouth every morning.    Yes Historical Provider, MD  tamsulosin (FLOMAX) 0.4 MG CAPS capsule Take 0.4 mg by mouth daily after supper.   Yes Historical Provider, MD  traMADol (ULTRAM) 50 MG tablet Take 50 mg by mouth every 8 (eight) hours as needed. pain   Yes Historical Provider, MD  pantoprazole (PROTONIX) 40 MG tablet Take 40 mg by mouth daily. IN AM 04/15/11 04/14/12  Jay M Pyrtle, MD    Current Facility-Administered Medications  Medication Dose Route Frequency Provider Last Rate Last Dose  . 0.9 %  sodium chloride infusion   Intravenous Continuous Kevin E Steinl, MD      . 0.9 %  sodium chloride infusion   Intravenous Continuous Costin Gherghe, MD 100 mL/hr at 10/28/12 0223    . acetaminophen (TYLENOL) tablet 650 mg  650 mg Oral Q6H PRN Costin Gherghe, MD       Or  . acetaminophen (TYLENOL) suppository 650 mg  650 mg Rectal Q6H PRN Costin Gherghe, MD      . clonazePAM (KLONOPIN) tablet 0.5 mg  0.5 mg Oral TID PRN Costin Gherghe, MD      . escitalopram (LEXAPRO) tablet 10 mg  10 mg Oral Daily Costin Gherghe, MD      . folic acid (FOLVITE) tablet 1 mg  1 mg Oral Daily Costin Gherghe, MD   1 mg at 10/28/12 0928  . HYDROcodone-acetaminophen (NORCO/VICODIN) 5-325 MG per  tablet 1 tablet  1 tablet Oral Q6H PRN Costin Gherghe, MD   1 tablet at 10/28/12 0657  . methocarbamol (ROBAXIN) tablet 500 mg  500 mg Oral Q8H PRN Costin Gherghe, MD      . pantoprazole (PROTONIX) injection 40 mg  40 mg Intravenous Q12H Costin Gherghe, MD   40 mg at 10/28/12 0928  . simvastatin (ZOCOR) tablet 40 mg  40 mg Oral q morning - 10a Costin Gherghe, MD   40 mg at 10/28/12 0928  . sodium chloride 0.9 % injection 3 mL  3 mL Intravenous Q12H   Costin Gherghe, MD   3 mL at 10/28/12 1000    Allergies as of 10/27/2012 - Review Complete 10/27/2012  Allergen Reaction Noted  . Sulfa antibiotics  03/21/2011  . Tetracyclines & related  03/21/2011    Family History  Problem Relation Age of Onset  . Hypertension Father   . Breast cancer Sister     History   Social History  . Marital Status: Married    Spouse Name: N/A    Number of Children: 2  . Years of Education: N/A   Occupational History  . OWNER    Social History Main Topics  . Smoking status: Former Smoker -- 1.00 packs/day for 25 years    Types: Cigarettes    Quit date: 02/14/1998  . Smokeless tobacco: Never Used  . Alcohol Use: Yes     Comment: 2 nights a week, not much though  . Drug Use: No  . Sexual Activity: Not on file   Other Topics Concern  . 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 Semi-retired. Have two children, and 2 grandchildren. No cane or walker. [name] = wife.     Review of Systems: Pertinent positive and negative review of systems were noted in the above HPI section.  All other review of systems was otherwise negative. Physical Exam:sweats, anore Vital signs in last 24 hours: Temp:  [97.3 F (36.3 C)-98.2 F (36.8 C)] 97.7 F (36.5 C) (09/14 0522) Pulse Rate:  [50-66] 53 (09/14 0522) Resp:  [12-16] 16 (09/14 0522) BP: (98-124)/(53-76) 123/75 mmHg (09/14 0522) SpO2:  [99 %-100 %] 99 %  (09/14 0522) Weight:  [161 lb 9.6 oz (73.301 kg)] 161 lb 9.6 oz (73.301 kg) (09/13 2020) Last BM Date: 10/27/12 General:   Alert,  Well-developed, well-nourished,WM, pleasant and cooperative in NAD Head:  Normocephalic and atraumatic. Eyes:  Sclera clear, no icterus.   Conjunctiva pink. Ears:  Normal auditory acuity. Nose:  No deformity, discharge,  or lesions. Mouth:  No deformity or lesions.   Neck:  Supple; no masses or thyromegaly. Lungs:  Clear throughout to auscultation.   No wheezes, crackles, or rhonchi. Heart:  Regular rate and rhythm; soft  murmur Abdomen:  Soft,nontender, BS active,nonpalp mass or hsm.   Rectal:  Deferred light brown stool in ER -hemocult pending Msk:  Symmetrical without gross deformities. . Pulses:  Normal pulses noted. Extremities:  Without clubbing or edema. Neurologic:  Alert and  oriented x4;  grossly normal neurologically. Skin:  Intact without significant lesions or rashes.. Psych:  Alert and cooperative. Normal mood and affect.  Intake/Output from previous day: 09/13 0701 - 09/14 0700 In: 1296.7 [P.O.:360; I.V.:411.7; Blood:525] Out: 675 [Urine:675] Intake/Output this shift:    Lab Results:  Recent Labs  10/27/12 1720  WBC 4.0  HGB 6.9*  HCT 22.1*  PLT 197   BMET  Recent Labs  10/27/12 1720  NA 136  K 4.1  CL 100  CO2 29  GLUCOSE 98  BUN 20  CREATININE 1.54*  CALCIUM 8.7   LFT  Recent Labs  10/28/12 0510  PROT 6.1  ALBUMIN 3.4*  AST 12  ALT 8  ALKPHOS 39  BILITOT 0.5  BILIDIR <0.1  IBILI NOT CALCULATED     Studies/Results: No results found.  IMPRESSION:  #1  62-year-old white male with recurrent profound microcytic anemia, iron deficient. 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 EGD in February of 213 showed what appears to be rather mild changes.   patient will need repeat EGD to see if progressive changes consistent with GAVE, and if so benefit  from ablation. Patient also has history of adenomatous colon polyps with last colonoscopy 4-1/2 years ago and therefore feel that repeat colonoscopy is indicated as well to rule out occult lesion #2 history of urothelial cancer status post right ureteronephrectomy 2012 #3 hypertension #4 chronic renal insufficiency #5 anxiety  PLAN: #1 stop prophylactic baby aspirin in this patient with no documented coronary artery disease #2 transfuse to keep hemoglobin in the 8-9 range #3 restart oral iron-Nu-Iron 150 twice daily and he may need to stay on iron indefinitely depending on findings at endoscopic evaluation #4 will schedule for colonoscopy and EGD with possible APC with Dr. Perry Monday afternoon. Seizures were discussed in detail with the patient and his wife and they are agreeable to proceed.   Amy Esterwood  10/28/2012, 10:11 AM   GI ATTENDING   History, laboratories, x-rays, prior endoscopy reports reviewed. Patient personally seen and examined. His wife is in the room. Agree with H&P as outlined above. Patient presents with symptomatic iron deficiency anemia. He has a history of colonic adenomas and GI AVMs. Prior history of anemia workup as outlined. Stable and feeling better after blood transfusion. GI review of systems is entirely negative. Agree with plans for colonoscopy and upper endoscopy tomorrow.The nature of the procedure, as well as the risks, benefits, and alternatives were carefully and thoroughly reviewed with the patient. Ample time for discussion and questions allowed. The patient understood, was satisfied, and agreed to proceed.  John N. Perry, Jr., M.D. [facility] Division of Gastroenterology   

## 2012-10-29 ENCOUNTER — Encounter (HOSPITAL_COMMUNITY)
Admission: EM | Disposition: A | Discharge status: Home / Self Care | Payer: Self-pay | Source: Home / Self Care | Attending: Internal Medicine

## 2012-10-29 ENCOUNTER — Encounter (HOSPITAL_COMMUNITY): Payer: Self-pay | Admitting: *Deleted

## 2012-10-29 DIAGNOSIS — D126 Benign neoplasm of colon, unspecified: Secondary | ICD-10-CM

## 2012-10-29 DIAGNOSIS — K2971 Gastritis, unspecified, with bleeding: Secondary | ICD-10-CM

## 2012-10-29 HISTORY — PX: COLONOSCOPY: SHX5424

## 2012-10-29 HISTORY — PX: HOT HEMOSTASIS: SHX5433

## 2012-10-29 HISTORY — PX: ESOPHAGOGASTRODUODENOSCOPY: SHX5428

## 2012-10-29 LAB — OCCULT BLOOD, POC DEVICE: Fecal Occult Bld: NEGATIVE

## 2012-10-29 LAB — CBC
HCT: 26.1 % — ABNORMAL LOW (ref 39.0–52.0)
Hemoglobin: 8.2 g/dL — ABNORMAL LOW (ref 13.0–17.0)
MCH: 24 pg — ABNORMAL LOW (ref 26.0–34.0)
MCHC: 31.4 g/dL (ref 30.0–36.0)
MCV: 76.5 fL — ABNORMAL LOW (ref 78.0–100.0)
Platelets: 163 10*3/uL (ref 150–400)
RBC: 3.41 MIL/uL — ABNORMAL LOW (ref 4.22–5.81)
RDW: 15.1 % (ref 11.5–15.5)
WBC: 4.3 10*3/uL (ref 4.0–10.5)

## 2012-10-29 LAB — BASIC METABOLIC PANEL
BUN: 13 mg/dL (ref 6–23)
CO2: 25 mEq/L (ref 19–32)
Calcium: 8.6 mg/dL (ref 8.4–10.5)
Chloride: 106 mEq/L (ref 96–112)
Creatinine, Ser: 1.4 mg/dL — ABNORMAL HIGH (ref 0.50–1.35)
GFR calc Af Amer: 60 mL/min — ABNORMAL LOW (ref >90)
GFR calc non Af Amer: 52 mL/min — ABNORMAL LOW (ref >90)
Glucose, Bld: 89 mg/dL (ref 70–99)
Potassium: 3.7 mEq/L (ref 3.5–5.1)
Sodium: 139 mEq/L (ref 135–145)

## 2012-10-29 LAB — TYPE AND SCREEN
ABO/RH(D): O POS
Antibody Screen: NEGATIVE
Unit division: 0
Unit division: 0

## 2012-10-29 SURGERY — COLONOSCOPY
Anesthesia: Moderate Sedation

## 2012-10-29 MED ORDER — MIDAZOLAM HCL 10 MG/2ML IJ SOLN
INTRAMUSCULAR | Status: AC
  Filled 2012-10-29: qty 2

## 2012-10-29 MED ORDER — FENTANYL CITRATE 0.05 MG/ML IJ SOLN
INTRAMUSCULAR | Status: AC
  Filled 2012-10-29: qty 2

## 2012-10-29 MED ORDER — MIDAZOLAM HCL 10 MG/2ML IJ SOLN
INTRAMUSCULAR | Status: DC | PRN
  Administered 2012-10-29 (×5): 2 mg via INTRAVENOUS
  Administered 2012-10-29: 1 mg via INTRAVENOUS
  Administered 2012-10-29 (×2): 2 mg via INTRAVENOUS

## 2012-10-29 MED ORDER — FENTANYL CITRATE 0.05 MG/ML IJ SOLN
INTRAMUSCULAR | Status: DC | PRN
  Administered 2012-10-29 (×7): 25 ug via INTRAVENOUS

## 2012-10-29 MED ORDER — BUTAMBEN-TETRACAINE-BENZOCAINE 2-2-14 % EX AERO
INHALATION_SPRAY | CUTANEOUS | Status: DC | PRN
  Administered 2012-10-29: 2 via TOPICAL

## 2012-10-29 NOTE — H&P (View-Only) (Signed)
Referring Provider: [facility] Primary Care Physician:  Gaspar Skeeters, MD Primary Gastroenterologist:  Dr.Pyrtle  Reason for Consultation:  Microcytic anemia  HPI: George Jacobson is a 62 y.o. male admitted last evening through the emergency room after he presented with complaints of progressive fatigue. Patient had labs done last week as an outpatient which showed a hemoglobin of 7.2. Labs in the ER showed hemoglobin of 6.9 with an MCV of 73. 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 He is known to Dr. Rhea Belton from evaluation done in 2013 at which time he had presented with profound anemia and iron deficiency He underwent upper endoscopy in February of 2013 which showed gastritis and duodenitis, and concern for possible gave syndrome. Biopsies were done showing chronic gastritis H. pylori. He also underwent capsule endoscopy at that time which was negative with the exception of one very small distal AVM. Patient has history of adenomatous colon polyps-had undergone previous colonoscopies in [place] The last was done in 2010 and was apparently negative. Decision was made to do followup colonoscopy in 2015.   patient states he had done well for over a year. We have documented a hemoglobin of 11.8 in August of 2013. He says he did CBCs done every 6 months by his urologist and had hemoglobin 11.7 in the winter of 2014. He has not been on iron recently. He  has not noticed any melena or hematochezia. He has no complaints of abdominal pain, indigestion, nausea etc. He has noticed increased fatigue over the past 2 months ,and  says very recently has had some vague nausea. He was started back on a baby aspirin about a year ago no other NSAIDs.. Patient brought his outpatient labs with him an iron studies were also done on 10/24/2012 showing an iron of 28 and sparing of 8 and ferritin of  6   Past Medical History  Diagnosis Date  . 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 Hypertension   . Cancer 10/2010    right kidney cancer  . Blood transfusion   . GERD (gastroesophageal reflux disease)   . Headache(784.0)   . Arthritis     generalized  . Depression   . Pneumonia     DEC 2012 HOSP AT [facility] WITH PNEUMONIA  . Sleep apnea     TEST WAS YRS AGO-DID USE CPAP FOR A WHILE--BUT NO LONGER USES--AND DOES NOT FEEL SLEEPY DURING THE DAY TIME ANYMORE  . 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  [name] Anemia     DEC 2012 -REQUIRED TRANSFUSIONS AND ANEMIA AND TRANSFUSIONS AGAIN FEB 2013--DX WITH "GAVE"    Past Surgical History  Procedure Laterality Date  . Laparoscopic nephrectomy  10/2010    total   . Foot surgery  2000    both foot sx for bunion  . Varicose vein surgery  1986  . Inner ear surgery  1984    right  ear  . Hernia repair  1954    left   . Esophagogastroduodenoscopy  03/23/2011    Procedure: ESOPHAGOGASTRODUODENOSCOPY (EGD);  Surgeon: Erick Blinks, MD;  Location: [facility];  Service: Gastroenterology;  Laterality: N/A;  . Givens capsule study  03/24/2011    Procedure: GIVENS CAPSULE STUDY;  Surgeon: Vonna Kotyk  Pyrtle, MD;  Location: [facility];  Service: Gastroenterology;  Laterality: N/A;    Prior to Admission medications   Medication Sig Start Date End Date Taking? Authorizing Provider  aspirin EC 81 MG tablet Take 81 mg by mouth daily.   Yes Historical Provider, MD  clonazePAM (KLONOPIN) 0.5 MG tablet Take 0.5 mg by mouth 3 (three) times daily as needed for anxiety.   Yes Historical Provider, MD  docusate sodium (COLACE) 100 MG capsule Take 100 mg by mouth 2 (two) times daily as needed for constipation.   Yes Historical Provider, MD  escitalopram (LEXAPRO) 10 MG tablet Take 10 mg by mouth daily before  breakfast.   Yes Historical Provider, MD  folic acid (FOLVITE) 400 MCG tablet Take 800 mcg by mouth daily.   Yes Historical Provider, MD  HYDROcodone-acetaminophen (NORCO) 7.5-325 MG per tablet Take 1 tablet by mouth every 4 (four) hours as needed for pain.   Yes Historical Provider, MD  losartan (COZAAR) 50 MG tablet Take 50 mg by mouth daily.   Yes Historical Provider, MD  methocarbamol (ROBAXIN) 500 MG tablet Take 500 mg by mouth 3 (three) times daily.   Yes Historical Provider, MD  Omega-3 Fatty Acids (FISH OIL) 1200 MG CAPS Take 1,200 mg by mouth daily.   Yes Historical Provider, MD  pantoprazole (PROTONIX) 40 MG tablet Take 40 mg by mouth daily.   Yes Historical Provider, MD  simvastatin (ZOCOR) 40 MG tablet Take 40 mg by mouth every morning.    Yes Historical Provider, MD  tamsulosin (FLOMAX) 0.4 MG CAPS capsule Take 0.4 mg by mouth daily after supper.   Yes Historical Provider, MD  traMADol (ULTRAM) 50 MG tablet Take 50 mg by mouth every 8 (eight) hours as needed. pain   Yes Historical Provider, MD  pantoprazole (PROTONIX) 40 MG tablet Take 40 mg by mouth daily. IN AM 04/15/11 04/14/12  Beverley Fiedler, MD    Current Facility-Administered Medications  Medication Dose Route Frequency Provider Last Rate Last Dose  . 0.9 %  sodium chloride infusion   Intravenous Continuous Suzi Roots, MD      . 0.9 %  sodium chloride infusion   Intravenous Continuous Pamella Pert, MD 100 mL/hr at 10/28/12 0223    . acetaminophen (TYLENOL) tablet 650 mg  650 mg Oral Q6H PRN Pamella Pert, MD       Or  . acetaminophen (TYLENOL) suppository 650 mg  650 mg Rectal Q6H PRN Pamella Pert, MD      . clonazePAM (KLONOPIN) tablet 0.5 mg  0.5 mg Oral TID PRN Pamella Pert, MD      . escitalopram (LEXAPRO) tablet 10 mg  10 mg Oral Daily Pamella Pert, MD      . folic acid (FOLVITE) tablet 1 mg  1 mg Oral Daily Pamella Pert, MD   1 mg at 10/28/12 0928  . HYDROcodone-acetaminophen (NORCO/VICODIN) 5-325 MG per  tablet 1 tablet  1 tablet Oral Q6H PRN Pamella Pert, MD   1 tablet at 10/28/12 0657  . methocarbamol (ROBAXIN) tablet 500 mg  500 mg Oral Q8H PRN Pamella Pert, MD      . pantoprazole (PROTONIX) injection 40 mg  40 mg Intravenous Q12H Pamella Pert, MD   40 mg at 10/28/12 0928  . simvastatin (ZOCOR) tablet 40 mg  40 mg Oral q morning - 10a Pamella Pert, MD   40 mg at 10/28/12 0928  . sodium chloride 0.9 % injection 3 mL  3 mL Intravenous Q12H  Pamella Pert, MD   3 mL at 10/28/12 1000    Allergies as of 10/27/2012 - Review Complete 10/27/2012  Allergen Reaction Noted  . Sulfa antibiotics  03/21/2011  . Tetracyclines & related  03/21/2011    Family History  Problem Relation Age of Onset  . Hypertension Father   . Breast cancer Sister     History   Social History  . Marital Status: Married    Spouse Name: N/A    Number of Children: 2  . Years of Education: N/A   Occupational History  . OWNER    Social History Main Topics  . Smoking status: Former Smoker -- 1.00 packs/day for 25 years    Types: Cigarettes    Quit date: 02/14/1998  . Smokeless tobacco: Never Used  . Alcohol Use: Yes     Comment: 2 nights a week, not much though  . Drug Use: No  . Sexual Activity: Not on file   Other Topics Concern  . 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 Semi-retired. Have two children, and 2 grandchildren. No cane or walker. [name] = wife.     Review of Systems: Pertinent positive and negative review of systems were noted in the above HPI section.  All other review of systems was otherwise negative. Physical Exam:sweats, anore Vital signs in last 24 hours: Temp:  [97.3 F (36.3 C)-98.2 F (36.8 C)] 97.7 F (36.5 C) (09/14 0522) Pulse Rate:  [50-66] 53 (09/14 0522) Resp:  [12-16] 16 (09/14 0522) BP: (98-124)/(53-76) 123/75 mmHg (09/14 0522) SpO2:  [99 %-100 %] 99 %  (09/14 0522) Weight:  [161 lb 9.6 oz (73.301 kg)] 161 lb 9.6 oz (73.301 kg) (09/13 2020) Last BM Date: 10/27/12 General:   Alert,  Well-developed, well-nourished,WM, pleasant and cooperative in NAD Head:  Normocephalic and atraumatic. Eyes:  Sclera clear, no icterus.   Conjunctiva pink. Ears:  Normal auditory acuity. Nose:  No deformity, discharge,  or lesions. Mouth:  No deformity or lesions.   Neck:  Supple; no masses or thyromegaly. Lungs:  Clear throughout to auscultation.   No wheezes, crackles, or rhonchi. Heart:  Regular rate and rhythm; soft  murmur Abdomen:  Soft,nontender, BS active,nonpalp mass or hsm.   Rectal:  Deferred light brown stool in ER -hemocult pending Msk:  Symmetrical without gross deformities. . Pulses:  Normal pulses noted. Extremities:  Without clubbing or edema. Neurologic:  Alert and  oriented x4;  grossly normal neurologically. Skin:  Intact without significant lesions or rashes.. Psych:  Alert and cooperative. Normal mood and affect.  Intake/Output from previous day: 09/13 0701 - 09/14 0700 In: 1296.7 [P.O.:360; I.V.:411.7; Blood:525] Out: 675 [Urine:675] Intake/Output this shift:    Lab Results:  Recent Labs  10/27/12 1720  WBC 4.0  HGB 6.9*  HCT 22.1*  PLT 197   BMET  Recent Labs  10/27/12 1720  NA 136  K 4.1  CL 100  CO2 29  GLUCOSE 98  BUN 20  CREATININE 1.54*  CALCIUM 8.7   LFT  Recent Labs  10/28/12 0510  PROT 6.1  ALBUMIN 3.4*  AST 12  ALT 8  ALKPHOS 39  BILITOT 0.5  BILIDIR <0.1  IBILI NOT CALCULATED     Studies/Results: No results found.  IMPRESSION:  #70  62 year old white male with recurrent profound microcytic anemia, iron deficient. 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 EGD in February of 213 showed what appears to be rather mild changes.   patient will need repeat EGD to see if progressive changes consistent with GAVE, and if so benefit  from ablation. Patient also has history of adenomatous colon polyps with last colonoscopy 4-1/2 years ago and therefore feel that repeat colonoscopy is indicated as well to rule out occult lesion #2 history of urothelial cancer status post right ureteronephrectomy 2012 #3 hypertension #4 chronic renal insufficiency #5 anxiety  PLAN: #1 stop prophylactic baby aspirin in this patient with no documented coronary artery disease #2 transfuse to keep hemoglobin in the 8-9 range #3 restart oral iron-Nu-Iron 150 twice daily and he may need to stay on iron indefinitely depending on findings at endoscopic evaluation #4 will schedule for colonoscopy and EGD with possible APC with Dr. Marina Goodell Monday afternoon. Seizures were discussed in detail with the patient and his wife and they are agreeable to proceed.   Amy Esterwood  10/28/2012, 10:11 AM   GI ATTENDING   History, laboratories, x-rays, prior endoscopy reports reviewed. Patient personally seen and examined. His wife is in the room. Agree with H&P as outlined above. Patient presents with symptomatic iron deficiency anemia. He has a history of colonic adenomas and GI AVMs. Prior history of anemia workup as outlined. Stable and feeling better after blood transfusion. GI review of systems is entirely negative. Agree with plans for colonoscopy and upper endoscopy tomorrow.The nature of the procedure, as well as the risks, benefits, and alternatives were carefully and thoroughly reviewed with the patient. Ample time for discussion and questions allowed. The patient understood, was satisfied, and agreed to proceed.  Wilhemina Bonito. Eda Keys., M.D. [facility] Division of Gastroenterology

## 2012-10-29 NOTE — Op Note (Signed)
[facility] [address] [place], [address]   ENDOSCOPY PROCEDURE REPORT  PATIENT: George, Jacobson  MR#: [id number] BIRTHDATE: 12/17/1950 , 62  yrs. old GENDER: Male ENDOSCOPIST: Roxy Cedar, MD REFERRED BY:  [facility] PROCEDURE DATE:  10/29/2012 PROCEDURE:  EGD w/ biopsy ASA CLASS:     Class II INDICATIONS:  Iron deficiency anemia.  Hemoccult NEGATIVE MEDICATIONS: Versed 10 mg IV and Fentanyl 100 mcg IV TOPICAL ANESTHETIC: Cetacaine Spray  DESCRIPTION OF PROCEDURE: After the risks benefits and alternatives of the procedure were thoroughly explained, informed consent was obtained.  The endoscope [id number] endoscope was introduced through the mouth and advanced to the third portion of the duodenum. Without limitations.  The instrument was slowly withdrawn as the mucosa was fully examined.      The upper, middle and distal third of the esophagus were carefully inspected and no abnormalities were noted.  The z-line was well seen at the GEJ.  The endoscope was pushed into the fundus which was normal including a retroflexed view.  The antrum, gastric body, first and second part of the duodenum were unremarkable. Duodenal biopsies taken to rule out sprue.  Retroflexed views revealed no abnormalities.     The scope was then withdrawn from the patient and the procedure completed.  COMPLICATIONS: There were no complications. ENDOSCOPIC IMPRESSION: 1. Normal EGD status post duodenal biopsies  RECOMMENDATIONS: 1.  Await biopsy results 2.  Proceed with a Colonoscopy.  REPEAT EXAM:  eSigned:  Roxy Cedar, MD 10/29/2012 3:43 PM   XB:JYNWGN, Vonna Kotyk MD and The Patient

## 2012-10-29 NOTE — Op Note (Signed)
[facility] [address] [place], [address]   COLONOSCOPY PROCEDURE REPORT  PATIENT: George Jacobson, George Jacobson  MR#: [id number] BIRTHDATE: 09-Mar-1950 , 62  yrs. old GENDER: Male ENDOSCOPIST: Roxy Cedar, MD REFERRED XB:[facility] PROCEDURE DATE:  10/29/2012 PROCEDURE:   Colonoscopy with snare polypectomy x 1 First Screening Colonoscopy - Avg.  risk and is 50 yrs.  old or older - No.  Prior Negative Screening - Now for repeat screening. N/A  History of Adenoma - Now for follow-up colonoscopy & has been > or = to 3 yrs.  N/A  Polyps Removed Today? Yes. ASA CLASS:   Class II INDICATIONS:Iron Deficiency Anemia.   Hemoccult negative stool MEDICATIONS: Fentanyl 75 mcg IV and Versed 5 mg IV DESCRIPTION OF PROCEDURE:   After the risks benefits and alternatives of the procedure were thoroughly explained, informed consent was obtained.  A digital rectal exam revealed no abnormalities of the rectum.   The Pentax Colonoscope [id number] endoscope was introduced through the anus and advanced to the cecum, which was identified by both the appendix and ileocecal valve. No adverse events experienced.   The quality of the prep was excellent, using MoviPrep  The instrument was then slowly withdrawn as the colon was fully examined.  COLON FINDINGS: The mucosa appeared normal in the terminal ileum. A diminutive polyp was found in the descending colon.  A polypectomy was performed with a cold snare.  The resection was complete and the polyp tissue was completely retrieved.   Mild diverticulosis was noted in the sigmoid colon.   The colon mucosa was otherwise normal.  Retroflexed views revealed internal hemorrhoids. The time to cecum=3 minutes 0 seconds.  Withdrawal time=15 minutes 0 seconds.  The scope was withdrawn and the procedure completed. COMPLICATIONS: There were no complications.  ENDOSCOPIC IMPRESSION: 1.   Normal mucosa in the terminal ileum 2.   Diminutive polyp was  found in the descending colon; polypectomy was performed with a cold snare 3.   Mild diverticulosis was noted in the sigmoid colon 4.   The colon mucosa was otherwise normal . 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  RECOMMENDATIONS: 1. Repeat colonoscopy in 5 years if polyp adenomatous; otherwise 10 years with Dr. Rhea Belton 2. Iron supplement twice daily in differential. Have your family practitioner monitor your blood counts   eSigned:  Roxy Cedar, MD 10/29/2012 4:07 PM   cc: The Patient and Erick Blinks MD   PATIENT NAME:  [name], [name] MR#: [id number]

## 2012-10-29 NOTE — Interval H&P Note (Signed)
History and Physical Interval Note:  10/29/2012 3:36 PM  George Jacobson  has presented today for surgery, with the diagnosis of dysphagia  The various methods of treatment have been discussed with the patient and family. After consideration of risks, benefits and other options for treatment, the patient has consented to  Procedure(s): COLONOSCOPY (N/A) ESOPHAGOGASTRODUODENOSCOPY (EGD) (N/A) HOT HEMOSTASIS (ARGON PLASMA COAGULATION/BICAP) (N/A) as a surgical intervention .  The patient's history has been reviewed, patient examined, no change in status, stable for surgery.  I have reviewed the patient's chart and labs.  Questions were answered to the patient's satisfaction.     Yancey Flemings

## 2012-10-29 NOTE — Progress Notes (Signed)
[facility] PROGRESS NOTE  GREGOIRE Jacobson ZOX:[id number] DOB: 1950-05-24 DOA: 10/27/2012 PCP: George Skeeters, MD  Assessment/Plan: GI Bleed -2/2 chronic blood loss from presumed GAVE. -Appreciate GI input. -For EGD/colonoscopy later today.  Chronic Blood Loss Anemia/Iron Deficiency Anemia -Has been transfused 2 units of PRBCs. -Will start ferrous sulfate given ferritin of 6. -Recheck CBC in am.  Code Status: Full Code Family Communication: Patient only  Disposition Plan: Home when work up complete   Consultants:  GI    Antibiotics:  None   Subjective: No complaints  Objective: Filed Vitals:   10/28/12 0522 10/28/12 1438 10/28/12 2055 10/29/12 0511  BP: 123/75 108/63 135/76 121/67  Pulse: 53 60 58 50  Temp: 97.7 F (36.5 C) 98.5 F (36.9 C) 98.1 F (36.7 C) 97.5 F (36.4 C)  TempSrc: Oral Oral Oral Oral  Resp: 16 18 14 17   Height:      Weight:      SpO2: 99% 98% 99% 97%    Intake/Output Summary (Last 24 hours) at 10/29/12 1009 Last data filed at 10/29/12 1008  Gross per 24 hour  Intake   2350 ml  Output    575 ml  Net   1775 ml   Filed Weights   10/27/12 2020  Weight: 73.301 kg (161 lb 9.6 oz)    Exam:   General:  AA Ox3  Cardiovascular: RRR  Respiratory: CTA B  Abdomen: S/NT/ND/+BS  Extremities: no C/C/E   Neurologic:  Non-focal  Data Reviewed: Basic Metabolic Panel:  Recent Labs Lab 10/27/12 1720 10/29/12 0353  NA 136 139  K 4.1 3.7  CL 100 106  CO2 29 25  GLUCOSE 98 89  BUN 20 13  CREATININE 1.54* 1.40*  CALCIUM 8.7 8.6   Liver Function Tests:  Recent Labs Lab 10/28/12 0510  AST 12  ALT 8  ALKPHOS 39  BILITOT 0.5  PROT 6.1  ALBUMIN 3.4*   No results found for this basename: LIPASE, AMYLASE,  in the last 168 hours No results found for this basename: AMMONIA,  in the last 168 hours CBC:  Recent Labs Lab 10/27/12 1720 10/29/12 0353  WBC 4.0 4.3  HGB 6.9* 8.2*  HCT 22.1* 26.1*  MCV 73.2* 76.5*   PLT 197 163   Cardiac Enzymes: No results found for this basename: CKTOTAL, CKMB, CKMBINDEX, TROPONINI,  in the last 168 hours BNP (last 3 results) No results found for this basename: PROBNP,  in the last 8760 hours CBG: No results found for this basename: GLUCAP,  in the last 168 hours  No results found for this or any previous visit (from the past 240 hour(s)).   Studies: No results found.  Scheduled Meds: . escitalopram  10 mg Oral Daily  . ferrous sulfate  325 mg Oral TID WC  . folic acid  1 mg Oral Daily  . pantoprazole (PROTONIX) IV  40 mg Intravenous Q12H  . simvastatin  40 mg Oral q morning - 10a  . sodium chloride  3 mL Intravenous Q12H   Continuous Infusions: . sodium chloride    . sodium chloride 100 mL/hr at 10/28/12 2120    Principal Problem:   GI bleed Active Problems:   GAVE (gastric antral vascular ectasia)   Renal insufficiency   Gastritis with bleeding   Small bowel arteriovenous malformation   Anemia due to blood loss, chronic    Time spent: 35 minutes    George Jacobson,George Jacobson  [facility] Pager [phone]  If 7PM-7AM, please contact night-coverage  at [email or web address], password [facility] 10/29/2012, 10:09 AM  LOS: 2 days

## 2012-10-29 NOTE — Progress Notes (Signed)
Previously text paged hospitalist on call for IV pain medication since pt is NPO after 0500 and has no replacement for PO Vicodin.  Obtained order for 2mg  Morphine Q4h starting at 0500.  Pt takes Vicodin for chronic back pain.  Pt had to get up and down repeatedly after taking bowel prep and began to complain of severe back pain with activity.  Gave Morphine.  Contacted hospitalist, stated that it was okay to continue morphine now instead of waiting until 0500.  Pt is comfortable, stating a 0/10 pain.  Will continue to monitor.  Sherron Monday

## 2012-10-30 ENCOUNTER — Encounter: Payer: Self-pay | Admitting: Internal Medicine

## 2012-10-30 ENCOUNTER — Encounter (HOSPITAL_COMMUNITY): Payer: Self-pay | Admitting: Internal Medicine

## 2012-10-30 ENCOUNTER — Other Ambulatory Visit: Payer: Self-pay | Admitting: Nurse Practitioner

## 2012-10-30 DIAGNOSIS — D500 Iron deficiency anemia secondary to blood loss (chronic): Secondary | ICD-10-CM

## 2012-10-30 LAB — CBC
HCT: 26.2 % — ABNORMAL LOW (ref 39.0–52.0)
Hemoglobin: 8.2 g/dL — ABNORMAL LOW (ref 13.0–17.0)
MCH: 24 pg — ABNORMAL LOW (ref 26.0–34.0)
MCHC: 31.3 g/dL (ref 30.0–36.0)
MCV: 76.6 fL — ABNORMAL LOW (ref 78.0–100.0)
Platelets: 154 10*3/uL (ref 150–400)
RBC: 3.42 MIL/uL — ABNORMAL LOW (ref 4.22–5.81)
RDW: 15.3 % (ref 11.5–15.5)
WBC: 6.7 10*3/uL (ref 4.0–10.5)

## 2012-10-30 LAB — BASIC METABOLIC PANEL
BUN: 15 mg/dL (ref 6–23)
CO2: 26 mEq/L (ref 19–32)
Calcium: 8.5 mg/dL (ref 8.4–10.5)
Chloride: 107 mEq/L (ref 96–112)
Creatinine, Ser: 1.42 mg/dL — ABNORMAL HIGH (ref 0.50–1.35)
GFR calc Af Amer: 59 mL/min — ABNORMAL LOW (ref >90)
GFR calc non Af Amer: 51 mL/min — ABNORMAL LOW (ref >90)
Glucose, Bld: 98 mg/dL (ref 70–99)
Potassium: 4 mEq/L (ref 3.5–5.1)
Sodium: 140 mEq/L (ref 135–145)

## 2012-10-30 MED ORDER — FERROUS SULFATE 325 (65 FE) MG PO TABS: 325.0000 mg | ORAL_TABLET | Freq: Three times a day (TID) | ORAL | Status: DC

## 2012-10-30 NOTE — Care Management Note (Signed)
    Page 1 of 1   10/30/2012     1:11:18 PM   CARE MANAGEMENT NOTE 10/30/2012  Patient:  George Jacobson, George Jacobson   Account Number:  [id number]  Date Initiated:  10/28/2012  Documentation initiated by:  [facility]  Subjective/Objective Assessment:   62 year old male admitted with anemia.     Action/Plan:   From home.   Anticipated DC Date:  10/30/2012   Anticipated DC Plan:  HOME/SELF CARE      DC Planning Services  CM consult      Choice offered to / List presented to:             Status of service:  Completed, signed off Medicare Important Message given?  NA - LOS <3 / Initial given by admissions (If response is "NO", the following Medicare IM given date fields will be blank) Date Medicare IM given:   Date Additional Medicare IM given:    Discharge Disposition:  HOME/SELF CARE  Per UR Regulation:  Reviewed for med. necessity/level of care/duration of stay  If discussed at Long Length of Stay Meetings, dates discussed:    Comments:  10/30/12 Brandye Inthavong RN,BSN NCM [phone] D/C HOME NO NEEDS OR ORDERS.

## 2012-10-30 NOTE — Discharge Summary (Signed)
Physician Discharge Summary  George Jacobson AVW:[id number] DOB: 1951/01/13 DOA: 10/27/2012  PCP: Gaspar Skeeters, MD  Admit date: 10/27/2012 Discharge date: 10/30/2012  Time spent: 45 minutes  Recommendations for Outpatient Follow-up:  -Will be discharged home today. -Has follow up scheduled with GI for a Hb recheck.   Discharge Diagnoses:  Principal Problem:   GI bleed Active Problems:   GAVE (gastric antral vascular ectasia)   Renal insufficiency   Gastritis with bleeding   Small bowel arteriovenous malformation   Anemia due to blood loss, chronic   Benign neoplasm of colon   Discharge Condition: Stable and Improved  Filed Weights   10/27/12 2020  Weight: 73.301 kg (161 lb 9.6 oz)    History of present illness:  George Jacobson is a 62 y.o. 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 Rhea Belton as an outpatient presents today to the emergency room at the indication of his nephrologist after regular blood work showed that he has a hemoglobin of 7.2 in the office couple days ago. Patient had a hospitalization last year for a GI bleed and subsequent anemia. Over the past week, patient has been feeling more fatigued than normal as noted that he is more pale as well. He denies any chest pain, denies any shortness of breath. He endorses mild nausea and occasional lightheadedness. He denies any frank bright red blood per rectum or dark tarry stools. Patient has his CBC checked regularly and Hemoglobin has been stable until this week. He used to take iron supplements but has not taken any this year. In the ED he was found to have a Hb of 6.9 and hospitalist service was asked for admission.    Hospital Course:   GI Bleed  -2/2 chronic blood loss. ??AVM in small bowel as EGD/colonosocpy were negative for source of bleed. -Appreciate GI input.   Chronic Blood Loss Anemia/Iron Deficiency Anemia  -Has  been transfused 2 units of PRBCs.  -hb stable today at 8.6. -Will start ferrous sulfate TID given ferritin of 6.    Procedures: EGD:1. Normal EGD status post duodenal biopsies  Colonosocpy:ENDOSCOPIC IMPRESSION:  1. Normal mucosa in the terminal ileum  2. Diminutive polyp was found in the descending colon; polypectomy  was performed with a cold snare  3. Mild diverticulosis was noted in the sigmoid colon  4. The colon mucosa was otherwise normal . Suspect small bowel  AVMs, previously seen on capsule endoscopy, the cause for anemia.       Consultations:  GI  Discharge Instructions  Discharge Orders   Future Appointments Provider Department Dept Phone   11/27/2012 11:00 AM Beverley Fiedler, MD [facility] [phone]   Future Orders Complete By Expires   Diet - low sodium heart healthy  As directed    Discontinue IV  As directed    Increase activity slowly  As directed        Medication List    STOP taking these medications       aspirin EC 81 MG tablet      TAKE these medications       clonazePAM 0.5 MG tablet  Commonly known as:  KLONOPIN  Take 0.5 mg by mouth 3 (three) times daily as needed for anxiety.     docusate sodium 100 MG capsule  Commonly known as:  COLACE  Take 100 mg by mouth 2 (two) times daily as needed for constipation.  escitalopram 10 MG tablet  Commonly known as:  LEXAPRO  Take 10 mg by mouth daily before breakfast.     ferrous sulfate 325 (65 FE) MG tablet  Take 1 tablet (325 mg total) by mouth 3 (three) times daily with meals.     Fish Oil 1200 MG Caps  Take 1,200 mg by mouth daily.     folic acid 400 MCG tablet  Commonly known as:  FOLVITE  Take 800 mcg by mouth daily.     HYDROcodone-acetaminophen 7.5-325 MG per tablet  Commonly known as:  NORCO  Take 1 tablet by mouth every 4 (four) hours as needed for pain.     losartan 50 MG tablet  Commonly known as:  COZAAR  Take 50 mg by mouth daily.      methocarbamol 500 MG tablet  Commonly known as:  ROBAXIN  Take 500 mg by mouth 3 (three) times daily.     pantoprazole 40 MG tablet  Commonly known as:  PROTONIX  Take 40 mg by mouth daily.     simvastatin 40 MG tablet  Commonly known as:  ZOCOR  Take 40 mg by mouth every morning.     tamsulosin 0.4 MG Caps capsule  Commonly known as:  FLOMAX  Take 0.4 mg by mouth daily after supper.     traMADol 50 MG tablet  Commonly known as:  ULTRAM  Take 50 mg by mouth every 8 (eight) hours as needed. pain       Allergies  Allergen Reactions  . Sulfa Antibiotics     Headaches  . Tetracyclines & Related     Nausea       Follow-up Information   Follow up with Beverley Fiedler, MD On 11/27/2012. (at 11:00am)    Specialty:  Gastroenterology   Contact information:   [address] [place] [address] [phone]        The results of significant diagnostics from this hospitalization (including imaging, microbiology, ancillary and laboratory) are listed below for reference.    Significant Diagnostic Studies: No results found.  Microbiology: No results found for this or any previous visit (from the past 240 hour(s)).   Labs: Basic Metabolic Panel:  Recent Labs Lab 10/27/12 1720 10/29/12 0353 10/30/12 0515  NA 136 139 140  K 4.1 3.7 4.0  CL 100 106 107  CO2 29 25 26   GLUCOSE 98 89 98  BUN 20 13 15   CREATININE 1.54* 1.40* 1.42*  CALCIUM 8.7 8.6 8.5   Liver Function Tests:  Recent Labs Lab 10/28/12 0510  AST 12  ALT 8  ALKPHOS 39  BILITOT 0.5  PROT 6.1  ALBUMIN 3.4*   No results found for this basename: LIPASE, AMYLASE,  in the last 168 hours No results found for this basename: AMMONIA,  in the last 168 hours CBC:  Recent Labs Lab 10/27/12 1720 10/29/12 0353 10/30/12 0515  WBC 4.0 4.3 6.7  HGB 6.9* 8.2* 8.2*  HCT 22.1* 26.1* 26.2*  MCV 73.2* 76.5* 76.6*  PLT 197 163 154   Cardiac Enzymes: No results found for this basename: CKTOTAL, CKMB,  CKMBINDEX, TROPONINI,  in the last 168 hours BNP: BNP (last 3 results) No results found for this basename: PROBNP,  in the last 8760 hours CBG: No results found for this basename: GLUCAP,  in the last 168 hours     Signed:  Chaya Jan  [facility] Pager: [phone] 10/30/2012, 12:43 PM

## 2012-11-01 ENCOUNTER — Encounter: Payer: Self-pay | Admitting: Internal Medicine

## 2012-11-21 ENCOUNTER — Encounter: Payer: Self-pay | Admitting: Internal Medicine

## 2012-11-27 ENCOUNTER — Other Ambulatory Visit (INDEPENDENT_AMBULATORY_CARE_PROVIDER_SITE_OTHER): Payer: PRIVATE HEALTH INSURANCE

## 2012-11-27 ENCOUNTER — Ambulatory Visit (INDEPENDENT_AMBULATORY_CARE_PROVIDER_SITE_OTHER): Payer: PRIVATE HEALTH INSURANCE | Admitting: Internal Medicine

## 2012-11-27 ENCOUNTER — Encounter: Payer: Self-pay | Admitting: Internal Medicine

## 2012-11-27 VITALS — BP 114/70 | HR 60 | Wt 165.0 lb

## 2012-11-27 DIAGNOSIS — D509 Iron deficiency anemia, unspecified: Secondary | ICD-10-CM

## 2012-11-27 DIAGNOSIS — K900 Celiac disease: Secondary | ICD-10-CM

## 2012-11-27 DIAGNOSIS — Z86010 Personal history of colonic polyps: Secondary | ICD-10-CM

## 2012-11-27 LAB — CBC
HCT: 31.9 % — ABNORMAL LOW (ref 39.0–52.0)
Hemoglobin: 10.8 g/dL — ABNORMAL LOW (ref 13.0–17.0)
MCHC: 33.8 g/dL (ref 30.0–36.0)
MCV: 79.5 fl (ref 78.0–100.0)
Platelets: 184 10*3/uL (ref 150.0–400.0)
RBC: 4.01 Mil/uL — ABNORMAL LOW (ref 4.22–5.81)
RDW: 23.7 % — ABNORMAL HIGH (ref 11.5–14.6)
WBC: 4.3 10*3/uL — ABNORMAL LOW (ref 4.5–10.5)

## 2012-11-27 LAB — IGA: IgA: 337 mg/dL (ref 68–378)

## 2012-11-27 MED ORDER — PANTOPRAZOLE SODIUM 40 MG PO TBEC: 40.0000 mg | DELAYED_RELEASE_TABLET | Freq: Every day | ORAL | Status: DC

## 2012-11-27 NOTE — Progress Notes (Signed)
Subjective:    Patient ID: EANN CLELAND, male    DOB: 04-Feb-1951, 62 y.o.   MRN: [id number]  HPI Mr. Grzywacz is a 62 year old male with a past medical history of urothelial cancer status post nephrectomy, hypertension, and iron deficiency anemia felt secondary to GI blood loss he returns for followup. He is known to me from 2013 when he was hospitalized for heme-positive stool and severe anemia requiring blood transfusion. At that time he underwent an upper endoscopy which revealed antral gastritis, question a GAVE.  Capsule endoscopy was also performed which was negative except for one very small distal angiectasia. He was treated with iron replacement and serial blood counts. He was hospitalized again in September 2014 and seen by Dr. Marina Goodell.  During this hospitalization he was found to have a significant iron deficiency anemia requiring blood transfusion. He was not having any evidence of GI blood loss including no melena or hematochezia. During this hospitalization he was heme-negative. He had not been using NSAIDs though was taking a baby aspirin daily.   The patient had an upper endoscopy and colonoscopy performed during this hospitalization. EGD was normal with no evidence of antral gastritis. Biopsies of the small bowel were performed. Colonoscopy revealed normal terminal ileum and a diminutive polyp in the descending colon removed by cold snare. There was mild sigmoid diverticulosis.  Gastric biopsies revealed intraepithelial lymphocytosis and mild villous blunting raising the question of celiac disease. The diminutive left colon polyp was a tubular adenoma without high-grade dysplasia.  He returns today reporting he feels better after blood transfusion. Hemoglobin was checked yesterday by his primary care provider but the results are unknown. He denies fatigue. No chest pain or dyspnea. No abdominal pain. No nausea or vomiting. He is taking oral iron 325 mg 3 times daily. He has not seen a  significant change in his bowel habit. He is taking Colace and intermittent MiraLax for history of constipation.   Review of Systems As per history of present illness, otherwise negative  Current Medications, Allergies, Past Medical History, Past Surgical History, Family History and Social History were reviewed in [employer] record.     Objective:   Physical Exam BP 114/70  Pulse 60  Wt 165 lb (74.844 kg)  BMI 23.68 kg/m2 Constitutional: Well-developed and well-nourished. No distress. HEENT: Normocephalic and atraumatic.  Conjunctivae are somewhat pale.  No scleral icterus. Neck: Neck supple. Trachea midline. Cardiovascular: Normal rate, regular rhythm and intact distal pulses.  Pulmonary/chest: Effort normal and breath sounds normal. No wheezing, rales or rhonchi. Abdominal: Soft, nontender, nondistended. Bowel sounds active throughout. Extremities: no clubbing, cyanosis, or edema Neurological: Alert and oriented to person place and time. Skin: Skin is warm and dry. No rashes noted. Psychiatric: Normal mood and affect. Behavior is normal.  CBC    Component Value Date/Time   WBC 6.7 10/30/2012 0515   RBC 3.42* 10/30/2012 0515   RBC 1.96* 03/21/2011 1830   HGB 8.2* 10/30/2012 0515   HCT 26.2* 10/30/2012 0515   PLT 154 10/30/2012 0515   MCV 76.6* 10/30/2012 0515   MCH 24.0* 10/30/2012 0515   MCHC 31.3 10/30/2012 0515   RDW 15.3 10/30/2012 0515   LYMPHSABS 1.4 06/03/2011 1225   MONOABS 0.6 06/03/2011 1225   EOSABS 0.1 06/03/2011 1225   BASOSABS 0.0 06/03/2011 1225   Iron/TIBC/Ferritin    Component Value Date/Time   IRON 159* 10/28/2012 0510   TIBC 329 10/28/2012 0510   FERRITIN 6* 10/28/2012 0510  Assessment & Plan:  62 year old male with a past medical history of urothelial cancer status post nephrectomy, hypertension, and iron deficiency anemia felt secondary to GI blood loss he returns for followup.  1.  IDA/likely new dx celiac disease -- given his  history of ongoing significant iron deficiency anemia, requiring transfusion, and previous workup, it is felt likely that he has celiac disease. This is very likely mild and for the most part clinically asymptomatic at this time other than his symptomatic anemia before transfusion. He is not having abdominal pain, diarrhea. He denies rashes, joint pains. We have discussed his biopsy results in detail, and I have recommended a TTG antibody and IgA level. This will be confirmatory, though we already have the gold standard small bowel biopsies indicating likely celiac disease. NSAID enteritis is felt unlikely because he is/was not using them.  I have recommended initiation of a gluten-free diet and directed him to [email or web address]  He also will be referred to a nutritionist/dietitian to discuss initiation of gluten-free diet in [place], [place]. He will contact his PCP for this nutrition referral. I will repeat CBC here today for our records and see him back in 3-4 months.  He will continue oral iron replacement at his current dose for now.  He is asked to call me for any questions or concerns prior to followup.  2.  Hx of adenomatous colon polyp -- 1 diminutive tubular adenoma removed September 2014. Repeat colonoscopy for surveillance recommended September 2019

## 2012-11-27 NOTE — Patient Instructions (Signed)
Your physician has requested that you go to the basement for the following lab work before leaving today: Celiac panel, CBC  Continue taking: Iron, protonix   Discuss seeing a nutritionist with your PCP.  Follow up with Dr. Rhea Belton in office in 3-4 months  For more information you can go to  [email or web address]  Gluten-Free Diet Gluten is a protein found in many grains. Gluten is present in wheat, rye, and barley. Gluten from wheat, rye, and barley protein interferes with the absorption of food in people with gluten sensitivity. It may also cause intestinal injury when eaten by individuals with gluten sensitivity.  A sample piece (biopsy) of the small intestine is usually required for a positive diagnosis of gluten sensitivity. Dietary treatment consists of eliminating foods and food ingredients from wheat, rye, and barley. When these are taken out of the diet completely, most people regain function of the small intestine. Strict compliance is important even during symptom-free periods. People with gluten sensitivity need to be on a gluten-free diet for a lifetime. During the first stages of treatment, some people will also need to restrict dairy products that contain lactose, which is a naturally occurring sugar. Lactose is difficult to absorb when the small intestines are damaged (lactose intolerance).  WHO NEEDS THIS DIET Some people who have certain diseases need to be on a gluten-free diet. These diseases include:  Celiac disease.  Nontropical sprue.  Gluten-sensitive enteropathy.  Dermatitis herpetiformis. SPECIAL NOTES  Read all labels because gluten may have been added as an incidental ingredient. Words to check for on the label include: flour, starch, durum flour, graham flour, phosphated flour, self-rising flour, semolina, farina, modified food starch, cereal, thickening, fillers, emulsifiers, any kind of malt flavoring, and hydrolyzed vegetable protein. A registered dietician can help  you identify possible harmful ingredients in the foods you normally eat.  If you are not sure whether an ingredient contains gluten, check with the manufacturer. Note that some manufacturers may change ingredients without notice. Always read labels.  Since flour and cereal products are often used in the preparation of foods, it is important to be aware of the methods of preparation used, as well as the foods themselves. This is especially true when you are dining out. Starches  Allowed: Only those prepared from arrowroot, corn, potato, rice, and bean flours. Rice wafers(*), pure cornmeal tortillas, popcorn, some crackers, and chips(*). Hot cereals made from cornmeal. Ask your dietician which specific hot and cold cereals are allowed. White or sweet potatoes, yams, hominy, rice or wild rice, and special gluten-free pasta. Some oriental rice noodles or bean noodles.  Avoid: All wheat and rye cereals, wheat germ, barley, bran, graham, malt, bulgur, and millet(-). NOTE: Avoid cereals containing malt as a flavoring, such as rice cereal. Regular noodles, spaghetti, macaroni, and most packaged rice mixes(*). All others containing wheat, rye, or barley. Vegetables  Allowed: All plain, fresh, frozen, or canned vegetables.  Avoid: Creamed vegetables(*) and vegetables canned in sauces(*). Any prepared with wheat, rye, or barley. Fruit  Allowed: All fresh, frozen, canned, or dried fruits. Fruit juices.  Avoid: Thickened or prepared fruits and some pie fillings(*). Meat and Meat Substitutes  Allowed: Meat, fish, poultry, or eggs prepared without added wheat, rye, or barley. Luncheon meat(*), frankfurters(*), and pure meat. All aged cheese and processed cheese products(*). Cottage cheese(+) and cream cheese(+). Dried beans, dried peas, and lentils.  Avoid: Any meat or meat alternate containing wheat, rye, barley, or gluten stabilizers. Bread-containing products, such as Swiss steak,  croquettes, and  meatloaf. Tuna canned in vegetable broth(*); [place] with HVP injected as part of the basting; any cheese product containing oat gum as an ingredient. Milk  Allowed: Milk. Yogurt made with allowed ingredients(*).  Avoid: Commercial chocolate milk which may have cereal added(*). Malted milk. Soups and Combination Foods  Allowed: Homemade broth and soups made with allowed ingredients; some canned or frozen soups are allowed(*). Combination or prepared foods that do not contain gluten(*). Read labels.  Avoid: All soups containing wheat, rye, or barley flour. Bouillon and bouillon cubes that contain hydrolyzed vegetable protein (HVP). Combination or prepared foods that contain gluten(*). Desserts  Allowed:  Custard, some pudding mixes(*), homemade puddings from cornstarch, rice, and tapioca. Gelatin desserts, ices, and sherbet(*). Cake, cookies, and other desserts prepared with allowed flours. Some commercial ice creams(*). Ask your dietician about specific brands of dessert that are allowed.  Avoid: Cakes, cookies, doughnuts, and pastries that are prepared with wheat, rye, or barley flour. Some commercial ice creams(*), ice cream flavors which contain cookies, crumbs, or cheesecake(*). Ice cream cones. All commercially prepared mixes for cakes, cookies, and other desserts(*). Bread pudding and other puddings thickened with flour. Sweets  Allowed: Sugar, honey, syrup(*), molasses, jelly, jam, plain hard candy, marshmallows, gumdrops, homemade candies free from wheat, rye, or barley. Coconut.  Avoid: Commercial candies containing wheat, rye, or barley(*). Certain buttercrunch toffees are dusted with wheat flour. Ask your dietician about specific brands that are not allowed. Chocolate-coated nuts, which are often rolled in flour. Fats and Oils  Allowed: Butter, margarine, vegetable oil, sour cream(+), whipping cream, shortening, lard, cream, mayonnaise(*). Some commercial salad dressings(*). Peanut  butter.  Avoid: Some commercial salad dressings(*). Beverages  Allowed: Coffee (regular or decaffeinated), tea, herbal tea (read label to be sure that no wheat flour has been added). Carbonated beverages and some root beers(*). Wine, sake, and distilled spirits, such as gin, vodka, and whiskey.  Avoid:  Certain cereal beverages. Ask your dietician about specific brands that are not allowed. Beer (unless gluten-free), ale, malted milk, and some root beers, wine, and sake. Condiments/ Miscellaneous  Allowed: Salt, pepper, herbs, spices, extracts, and food colorings. Monosodium glutamate (MSG). Cider, rice, and wine vinegar. Baking soda and baking powder. Certain soy sauces. Ask your dietician about specific brands that are allowed. Nuts, coconut, chocolate, and pure cocoa powder.  Avoid: Some curry powder(*), some dry seasoning mixes(*), some gravy extracts(*), some meat sauces(*), some catsup(*), some prepared mustard(*), horseradish(*), some soy sauce(*), chip dips(*), and some chewing gum(*). Yeast extract (contains barley). Caramel color (may contain malt). Ask your dietician about specific brands of condiments to avoid. Flour and Thickening Agents  Allowed: Arrowroot starch (A); Corn bran (B); Corn flour (B,C,D); Corn germ (B); Cornmeal (B,C,D); Corn starch (A); Potato flour (B,C,E); Potato starch flour (B,C,E); Rice bran (B); Rice flours: Plain, brown (B,C,D,E), and Sweet (A,B,C,F). Rice polish (B,C,G); Soy flour (B,C,G); Tapioca starch (A). The flour and thickening agents described above are good for: (A) Good thickening agent (B) Good when combined with other flours (C) Best combined with milk and eggs in baked products (D) Best in grainy-textured products (E) Produces drier product than other flours (F) Produces moister product than other flours (G) Adds distinct flavor to product. Use in moderation. (*) Check labels and investigate any questionable ingredients.  (-) Additional  research is needed before this product can be recommended. (+) Check vegetable gum used. SAMPLE MEAL PLAN Breakfast   Orange juice.  Banana.  Rice or corn cereal.  Toast (gluten-free bread).  Heart-healthy tub margarine.  Jam.  Milk.  Coffee or tea. Lunch  Chicken salad sandwich (with gluten-free bread and mayonnaise).  Sliced tomatoes.  Heart-healthy tub margarine.  Apple.  Milk.  Coffee or tea. Dinner  [place].  Baked potato.  Broccoli.  Lettuce salad with gluten-free dressing.  Gluten-free bread.  Custard.  Heart-healthy margarine.  Coffee or tea. These meal plans are provided as samples. Your daily meal plans will vary. Document Released: 01/31/2005 Document Revised: 08/02/2011 Document Reviewed: 03/13/2011 [facility] Patient Information 2014 [place], [place].  Celiac Disease Celiac disease is a digestive disease that causes your body's natural defense system (immune system) to react against its own cells. It interferes with taking in (absorbing) nutrients from food. Celiac disease is also known as celiac sprue, nontropical sprue, and gluten-sensitive enteropathy. People who have celiac disease cannot tolerate gluten. Gluten is a protein found in wheat, rye, and barley. With time, celiac disease will damage the cells lining the small intestine. This leads to being unable to absorb nutrients from food (malabsorption), diarrhea, and nutritional problems. CAUSES  Celiac disease is genetic. This means you have a higher likelihood of getting the disease if someone in your family has or has had it. Up to 10% of your close relatives (parent, sibling, child) may also have the disease.  People with celiac disease tend to have other autoimmune diseases. The link may be genetic. These diseases include:  Dermatitis herpetiformis.  Thyroid disease.  Systemic lupus erythematosis.  Type 1 diabetes.  Liver disease.  Collagen vascular disease.  Rheumatoid  arthritis.  Sjgren's syndrome. SYMPTOMS  The symptoms of celiac disease vary from person to person. The symptoms are generally digestive or nutritional. Digestive symptoms include:  Recurring belly (abdominal) bloating and pain.  Gas.  Long-term (chronic) diarrhea.  Pale, bad-smelling, greasy, or oily stool. Nutritional symptoms include:  Failure to thrive in infants.  Delayed growth in children.  Weight loss in children and adults.  Missed menstrual periods (often due to extreme weight loss).  Anemia.  Weakening bones (osteoporosis).  Fatigue and weakness.  Tingling or other signs of nerve damage (peripheral neuropathy).  Depression. DIAGNOSIS  If your symptoms and physical exam suggest that a digestive disorder or malnutrition is present, your caregiver may suspect celiac disease. You may have already begun a gluten-free diet. If symptoms persist, testing may be needed to confirm the diagnosis. Some tests are best done while you are on a normal, unrestricted diet. Tests may include:  Blood tests to check for nutritional deficiencies.  Blood tests to look for evidence that the body is producing antibodies against its own small intestine cells.  Taking a tissue sample (biopsy) from the small bowel for evaluation.  X-rays of the small bowel.  Evaluating the stool for fat.  Tests to check for nutrient absorption from the intestine. TREATMENT  It is important to seek treatment. Untreated celiac disease can cause growth problems (in children), anemia, osteoporosis, and possible nerve problems. A pregnant patient with untreated celiac disease has a higher risk of miscarriage, and the fetus has an increased risk of low birth weight and other growth problems. If celiac disease is diagnosed in the early stages, treatment can allow you to live a long, nearly symptom-free life. Treatment includes following a gluten-free diet. This means avoiding all foods that contain gluten.  Eating even a small amount of gluten can damage your intestine. For most people, following this diet will stop symptoms. It will heal existing intestinal  damage and prevent further damage. Improvements begin within days of starting the diet. The small intestine is usually completely healed within 3 to 6 months, or it may take up to 2 years for older adults. A small percentage of people do not improve on the gluten-free diet. Depending on your age and the stage at which you were diagnosed, some problems such as delayed growth and discolored teeth may not improve. Sometimes, damaged intestines cannot heal. If your intestines are not absorbing enough nutrients, you may need to receive nutrition supplements through an intravenous (IV) tube. Drug treatments are being tested for unresponsive celiac disease. In this case, you may need to be evaluated for complications of the disease. Your caregiver may also recommend:  A pneumonia vaccination.  Nutrients and other treatments for any nutritional deficiencies. Your caregiver can provide you with more information on a gluten-free diet. Discussion with a dietician skilled in this illness will be valuable. Support groups may also be helpful. HOME CARE INSTRUCTIONS   Focus on a gluten-free diet. This diet must become a way of life.  Monitor your response to the gluten-free diet and treat any nutritional deficiencies.  Prepare ahead of time if you decide to eat outside the home.  Make and keep your regular follow-up visits with your caregiver.  Suggest to family members that they get screened for early signs of the disease. SEEK MEDICAL CARE IF:   You continue to have digestive symptoms (gas, cramping, diarrhea) despite a proper diet.  You have trouble sticking to the gluten-free diet.  You develop an itchy rash with groups of tiny blisters.  You develop severe weakness, balance problems, menstrual problems, or depression. Document Released: 01/31/2005  Document Revised: 04/25/2011 Document Reviewed: 05/20/2009 [facility] Patient Information 2014 [place], [place].

## 2012-11-28 LAB — TISSUE TRANSGLUTAMINASE, IGA: Tissue Transglutaminase Ab, IgA: 3.8 U/mL (ref <20)

## 2013-04-10 ENCOUNTER — Telehealth: Payer: Self-pay | Admitting: Internal Medicine

## 2013-04-10 NOTE — Telephone Encounter (Signed)
Yes it is okay to discontinue pantoprazole If in doing so he were to develop upper abdominal pain, nausea, vomiting, or frequent heartburn he should notify us Okay for daily low-dose aspirin

## 2013-04-10 NOTE — Telephone Encounter (Signed)
routing

## 2013-04-10 NOTE — Telephone Encounter (Signed)
Patient notified of Dr. Vena Rua recommendations.

## 2013-04-10 NOTE — Telephone Encounter (Signed)
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 He is asking if he needs to continue taking Protonix. He is also asking if he can take a low dose aspirin. His nephrologist is wanting him to restart this and he was taken off it when he had bleeding. Please, advise.

## 2013-04-17 ENCOUNTER — Other Ambulatory Visit: Payer: Self-pay | Admitting: Internal Medicine

## 2013-10-07 ENCOUNTER — Encounter (HOSPITAL_COMMUNITY): Payer: Self-pay | Admitting: Emergency Medicine

## 2013-10-07 ENCOUNTER — Inpatient Hospital Stay (HOSPITAL_COMMUNITY)
Admission: EM | Admit: 2013-10-07 | Discharge: 2013-10-09 | DRG: 379 | Disposition: A | Discharge status: Home / Self Care | Payer: Medicare Other | Attending: Internal Medicine | Admitting: Internal Medicine

## 2013-10-07 DIAGNOSIS — Z905 Acquired absence of kidney: Secondary | ICD-10-CM

## 2013-10-07 DIAGNOSIS — Z7982 Long term (current) use of aspirin: Secondary | ICD-10-CM | POA: Diagnosis not present

## 2013-10-07 DIAGNOSIS — K900 Celiac disease: Secondary | ICD-10-CM | POA: Diagnosis present

## 2013-10-07 DIAGNOSIS — F329 Major depressive disorder, single episode, unspecified: Secondary | ICD-10-CM | POA: Diagnosis present

## 2013-10-07 DIAGNOSIS — Z91199 Patient's noncompliance with other medical treatment and regimen due to unspecified reason: Secondary | ICD-10-CM

## 2013-10-07 DIAGNOSIS — Z9119 Patient's noncompliance with other medical treatment and regimen: Secondary | ICD-10-CM

## 2013-10-07 DIAGNOSIS — C689 Malignant neoplasm of urinary organ, unspecified: Secondary | ICD-10-CM

## 2013-10-07 DIAGNOSIS — Z87891 Personal history of nicotine dependence: Secondary | ICD-10-CM | POA: Diagnosis not present

## 2013-10-07 DIAGNOSIS — K2971 Gastritis, unspecified, with bleeding: Secondary | ICD-10-CM

## 2013-10-07 DIAGNOSIS — K219 Gastro-esophageal reflux disease without esophagitis: Secondary | ICD-10-CM | POA: Diagnosis present

## 2013-10-07 DIAGNOSIS — D509 Iron deficiency anemia, unspecified: Secondary | ICD-10-CM | POA: Diagnosis present

## 2013-10-07 DIAGNOSIS — K922 Gastrointestinal hemorrhage, unspecified: Secondary | ICD-10-CM | POA: Diagnosis present

## 2013-10-07 DIAGNOSIS — D126 Benign neoplasm of colon, unspecified: Secondary | ICD-10-CM

## 2013-10-07 DIAGNOSIS — N189 Chronic kidney disease, unspecified: Secondary | ICD-10-CM | POA: Diagnosis present

## 2013-10-07 DIAGNOSIS — I129 Hypertensive chronic kidney disease with stage 1 through stage 4 chronic kidney disease, or unspecified chronic kidney disease: Secondary | ICD-10-CM | POA: Diagnosis present

## 2013-10-07 DIAGNOSIS — N289 Disorder of kidney and ureter, unspecified: Secondary | ICD-10-CM | POA: Diagnosis present

## 2013-10-07 DIAGNOSIS — R195 Other fecal abnormalities: Secondary | ICD-10-CM | POA: Diagnosis present

## 2013-10-07 DIAGNOSIS — Z860100 Personal history of colon polyps, unspecified: Secondary | ICD-10-CM

## 2013-10-07 DIAGNOSIS — D649 Anemia, unspecified: Secondary | ICD-10-CM | POA: Diagnosis present

## 2013-10-07 DIAGNOSIS — K5520 Angiodysplasia of colon without hemorrhage: Secondary | ICD-10-CM

## 2013-10-07 DIAGNOSIS — M129 Arthropathy, unspecified: Secondary | ICD-10-CM | POA: Diagnosis present

## 2013-10-07 DIAGNOSIS — F3289 Other specified depressive episodes: Secondary | ICD-10-CM | POA: Diagnosis present

## 2013-10-07 DIAGNOSIS — Z8550 Personal history of malignant neoplasm of unspecified urinary tract organ: Secondary | ICD-10-CM

## 2013-10-07 HISTORY — DX: Gastrointestinal hemorrhage, unspecified: K92.2

## 2013-10-07 HISTORY — DX: Angiodysplasia of colon without hemorrhage: K55.20

## 2013-10-07 LAB — BASIC METABOLIC PANEL
Anion gap: 12 (ref 5–15)
BUN: 24 mg/dL — ABNORMAL HIGH (ref 6–23)
CO2: 25 mEq/L (ref 19–32)
Calcium: 8.9 mg/dL (ref 8.4–10.5)
Chloride: 99 mEq/L (ref 96–112)
Creatinine, Ser: 1.43 mg/dL — ABNORMAL HIGH (ref 0.50–1.35)
GFR calc Af Amer: 58 mL/min — ABNORMAL LOW (ref >90)
GFR calc non Af Amer: 50 mL/min — ABNORMAL LOW (ref >90)
Glucose, Bld: 104 mg/dL — ABNORMAL HIGH (ref 70–99)
Potassium: 4 mEq/L (ref 3.7–5.3)
Sodium: 136 mEq/L — ABNORMAL LOW (ref 137–147)

## 2013-10-07 LAB — CBC WITH DIFFERENTIAL/PLATELET
Basophils Absolute: 0 10*3/uL (ref 0.0–0.1)
Basophils Relative: 1 % (ref 0–1)
Eosinophils Absolute: 0.1 10*3/uL (ref 0.0–0.7)
Eosinophils Relative: 3 % (ref 0–5)
HCT: 18.5 % — ABNORMAL LOW (ref 39.0–52.0)
Hemoglobin: 6 g/dL — CL (ref 13.0–17.0)
Lymphocytes Relative: 26 % (ref 12–46)
Lymphs Abs: 1.3 10*3/uL (ref 0.7–4.0)
MCH: 28.8 pg (ref 26.0–34.0)
MCHC: 32.4 g/dL (ref 30.0–36.0)
MCV: 88.9 fL (ref 78.0–100.0)
Monocytes Absolute: 0.4 10*3/uL (ref 0.1–1.0)
Monocytes Relative: 7 % (ref 3–12)
Neutro Abs: 3.3 10*3/uL (ref 1.7–7.7)
Neutrophils Relative %: 64 % (ref 43–77)
Platelets: 204 10*3/uL (ref 150–400)
RBC: 2.08 MIL/uL — ABNORMAL LOW (ref 4.22–5.81)
RDW: 12.4 % (ref 11.5–15.5)
WBC: 5.2 10*3/uL (ref 4.0–10.5)

## 2013-10-07 LAB — POC OCCULT BLOOD, ED: Fecal Occult Bld: POSITIVE — AB

## 2013-10-07 LAB — PROTIME-INR
INR: 0.9 (ref 0.00–1.49)
Prothrombin Time: 12.2 seconds (ref 11.6–15.2)

## 2013-10-07 LAB — PREPARE RBC (CROSSMATCH)

## 2013-10-07 MED ORDER — SODIUM CHLORIDE 0.9 % IV BOLUS (SEPSIS)
250.0000 mL | Freq: Once | INTRAVENOUS | Status: AC
  Administered 2013-10-07: 250 mL via INTRAVENOUS

## 2013-10-07 MED ORDER — CLONAZEPAM 0.5 MG PO TABS
0.5000 mg | ORAL_TABLET | Freq: Three times a day (TID) | ORAL | Status: DC | PRN
  Administered 2013-10-07: 0.5 mg via ORAL
  Filled 2013-10-07: qty 1

## 2013-10-07 MED ORDER — SODIUM CHLORIDE 0.9 % IV SOLN
INTRAVENOUS | Status: DC
  Administered 2013-10-08: via INTRAVENOUS

## 2013-10-07 MED ORDER — TRAMADOL HCL 50 MG PO TABS
50.0000 mg | ORAL_TABLET | Freq: Two times a day (BID) | ORAL | Status: DC | PRN
  Administered 2013-10-07: 50 mg via ORAL
  Filled 2013-10-07: qty 1

## 2013-10-07 MED ORDER — SIMVASTATIN 40 MG PO TABS
40.0000 mg | ORAL_TABLET | Freq: Every morning | ORAL | Status: DC
  Filled 2013-10-07: qty 1

## 2013-10-07 MED ORDER — TAMSULOSIN HCL 0.4 MG PO CAPS
0.4000 mg | ORAL_CAPSULE | Freq: Every day | ORAL | Status: DC
Start: 2013-10-08 — End: 2013-10-09
  Administered 2013-10-08: 0.4 mg via ORAL
  Filled 2013-10-07 (×2): qty 1

## 2013-10-07 MED ORDER — FINASTERIDE 5 MG PO TABS
5.0000 mg | ORAL_TABLET | Freq: Every day | ORAL | Status: DC
  Administered 2013-10-08 – 2013-10-09 (×2): 5 mg via ORAL
  Filled 2013-10-07 (×2): qty 1

## 2013-10-07 MED ORDER — FERROUS SULFATE 325 (65 FE) MG PO TABS
325.0000 mg | ORAL_TABLET | Freq: Three times a day (TID) | ORAL | Status: DC
  Filled 2013-10-07 (×4): qty 1

## 2013-10-07 MED ORDER — PANTOPRAZOLE SODIUM 40 MG IV SOLR
40.0000 mg | Freq: Two times a day (BID) | INTRAVENOUS | Status: DC
  Administered 2013-10-07 – 2013-10-08 (×2): 40 mg via INTRAVENOUS
  Filled 2013-10-07 (×3): qty 40

## 2013-10-07 MED ORDER — ESCITALOPRAM OXALATE 20 MG PO TABS
20.0000 mg | ORAL_TABLET | Freq: Every day | ORAL | Status: DC
  Administered 2013-10-08 – 2013-10-09 (×2): 20 mg via ORAL
  Filled 2013-10-07 (×3): qty 1

## 2013-10-07 MED ORDER — SODIUM CHLORIDE 0.9 % IV SOLN: 10.0000 mL/h | Freq: Once | INTRAVENOUS | Status: DC

## 2013-10-07 MED ORDER — SODIUM CHLORIDE 0.9 % IV SOLN
INTRAVENOUS | Status: DC
  Administered 2013-10-07 – 2013-10-08 (×2): via INTRAVENOUS

## 2013-10-07 NOTE — H&P (Signed)
History and Physical    George Jacobson:[id number] DOB: [birth year] DOA: 10/07/2013  Referring physician: Dr. Clarene Duke PCP: Gaspar Skeeters, MD  Specialists: GI, Dr. Christella Hartigan called for am consult  Chief Complaint: weakness  HPI: George Jacobson is a 63 y.o. 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 He saw his PCP today and was directed to the ED once his Hb was seen at 5. He denies any frank bleeding in his stools, endorses darker stools in the past few days. Denies chest pain, nausea/vomiting or diarrhea. He has had no fevers or chills. He used to take iron supplements last year but hasn't taken any up until 2 days ago as he was started to feel lightheaded. Takes daily baby aspirin. He also reports feeling weak and lightheaded when standing. In the ED his Hb was found to be 6 and was orthostatic with positive FOBT and TRH asked for admission.   Review of Systems: as per HPI otherwise negative.  Past Medical History  Diagnosis Date  . 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 Hypertension   . Cancer 10/2010    right kidney cancer  . Blood transfusion   . GERD (gastroesophageal reflux disease)   . Headache(784.0)   . Arthritis     generalized  . Depression   . Pneumonia     DEC 2012 HOSP AT [facility] WITH PNEUMONIA  . Sleep apnea     TEST WAS YRS AGO-DID USE CPAP FOR A WHILE--BUT NO LONGER USES--AND DOES NOT FEEL SLEEPY DURING THE DAY TIME ANYMORE  . 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  [name] Anemia     DEC 2012 -REQUIRED TRANSFUSIONS AND ANEMIA AND TRANSFUSIONS AGAIN FEB 2013--DX  WITH "GAVE"  . Colon polyps     10/2012  . Tubular adenoma of colon     10/2012  . Angiodysplasia of intestine   . GI bleeding    Past Surgical History  Procedure Laterality Date  . Laparoscopic nephrectomy  10/2010    total   . Foot surgery  2000    both foot sx for bunion  . Varicose vein surgery  1986  . Inner ear surgery  1984    right  ear  . Hernia repair  1954    left   . Esophagogastroduodenoscopy  03/23/2011    Procedure: ESOPHAGOGASTRODUODENOSCOPY (EGD);  Surgeon: Erick Blinks, MD;  Location: [facility];  Service: Gastroenterology;  Laterality: N/A;  . Givens capsule study  03/24/2011    Procedure: GIVENS CAPSULE STUDY;  Surgeon: Erick Blinks, MD;  Location: [facility];  Service: Gastroenterology;  Laterality: N/A;  . Colonoscopy N/A 10/29/2012    Procedure: COLONOSCOPY;  Surgeon: Hilarie Fredrickson, MD;  Location: [facility];  Service: Endoscopy;  Laterality: N/A;  . Esophagogastroduodenoscopy N/A 10/29/2012    Procedure: ESOPHAGOGASTRODUODENOSCOPY (EGD);  Surgeon: Hilarie Fredrickson, MD;  Location: [facility];  Service: Endoscopy;  Laterality: N/A;  . Hot hemostasis N/A 10/29/2012    Procedure: HOT HEMOSTASIS (ARGON PLASMA COAGULATION/BICAP);  Surgeon: Hilarie Fredrickson, MD;  Location: [facility];  Service: Endoscopy;  Laterality: N/A;  Social History:  reports that he quit smoking about 15 years ago. His smoking use included Cigarettes. He has a 25 pack-year smoking history. He has never used smokeless tobacco. He reports that he drinks alcohol. He reports that he does not use illicit drugs.  Allergies  Allergen Reactions  . Sulfa Antibiotics     Headaches  . Tetracyclines & Related     Nausea    Family History  Problem Relation Age of Onset  . Hypertension Father   . Breast cancer Sister    Prior to Admission medications   Medication Sig Start Date End Date Taking? Authorizing Provider  aspirin 81 MG chewable tablet Chew 81 mg by mouth daily.   Yes Historical Provider, MD    clonazePAM (KLONOPIN) 0.5 MG tablet Take 0.5 mg by mouth 3 (three) times daily as needed for anxiety.   Yes Historical Provider, MD  escitalopram (LEXAPRO) 10 MG tablet Take 20 mg by mouth daily before breakfast.    Yes Historical Provider, MD  finasteride (PROSCAR) 5 MG tablet Take 5 mg by mouth daily.   Yes Historical Provider, MD  folic acid (FOLVITE) 400 MCG tablet Take 800 mcg by mouth daily.   Yes Historical Provider, MD  losartan (COZAAR) 50 MG tablet Take 50 mg by mouth daily.   Yes Historical Provider, MD  Milnacipran (SAVELLA) 50 MG TABS tablet Take 100 mg by mouth 2 (two) times daily.   Yes Historical Provider, MD  Omega-3 Fatty Acids (FISH OIL) 1200 MG CAPS Take 1,200 mg by mouth daily.   Yes Historical Provider, MD  simvastatin (ZOCOR) 40 MG tablet Take 40 mg by mouth every morning.    Yes Historical Provider, MD  tamsulosin (FLOMAX) 0.4 MG CAPS capsule Take 0.4 mg by mouth daily after supper.   Yes Historical Provider, MD  traMADol (ULTRAM) 50 MG tablet Take 50 mg by mouth every 8 (eight) hours as needed. pain   Yes Historical Provider, MD   Physical Exam: Filed Vitals:   10/07/13 1751  BP: 113/61  Pulse: 76  Temp: 98.2 F (36.8 C)  TempSrc: Oral  Resp: 12  SpO2: 98%     General:  No apparent distress, pleasant caucasian male  Eyes: PERRL, EOMI, no scleral icterus  ENT: moist oropharynx  Neck: supple, no JVD  Cardiovascular: regular rate without MRG; 2+ peripheral pulses  Respiratory: CTA biL, good air movement without wheezing, rhonchi or crackled  Abdomen: soft, non tender to palpation, positive bowel sounds, no guarding, no rebound  Skin: no rashes  Musculoskeletal: no peripheral edema  Psychiatric: normal mood and affect  Neurologic: non focal  Labs on Admission:  Basic Metabolic Panel:  Recent Labs Lab 10/07/13 1803  NA 136*  K 4.0  CL 99  CO2 25  GLUCOSE 104*  BUN 24*  CREATININE 1.43*  CALCIUM 8.9   CBC:  Recent Labs Lab  10/07/13 1803  WBC 5.2  NEUTROABS 3.3  HGB 6.0*  HCT 18.5*  MCV 88.9  PLT 204   Assessment/Plan Active Problems:   Anemia   Renal insufficiency   Heme positive stool   GI bleed   #1 GI bleed resulting in #2 anemia, Hb 6 on admission. Patient worked up for same in the past without a clear etiology. He also underwent a capsule study last year also negative. At his previous admission his FOBT was negative, however this has changed. He is clinically stable currently, normotensive, not tachycardic, will admit to MedSurg, transfuse 2U pRBC and  IVF, called and discussed with Dr. Christella Hartigan from GI for an am consult, will make patient NPO after midnight.  - IV protonix - hold aspirin - continue iron supplements   #3 CKD - patient's renal function is at baseline, repeat BMP in am  #4 Celiac disease - gluten free diet  Diet: NPO p MN Fluids: NS at 75 cc/h DVT Prophylaxis: SCDs  Code Status: Full  Family Communication: wife bedside  Disposition Plan: inpatient   Time spent: 73  This note has been created with Education officer, environmental. Any transcriptional errors are unintentional.   Mitali Shenefield M. Elvera Lennox, MD [facility] Pager [phone]  If 7PM-7AM, please contact night-coverage [email or web address] Password [employer] 10/07/2013, 7:21 PM

## 2013-10-07 NOTE — ED Notes (Addendum)
Patient is from home. Patient was sent from primary care MD due to Hemoglobin of 5 that was drawn this afternoon.[name] He went to primary care MD for weakness, dizziness, SOB and HA. He states he been feeling this way since last Wednesday. Patient has hx of needing blood transfusions.

## 2013-10-07 NOTE — ED Provider Notes (Signed)
CSN: [id number]     Arrival date & time 10/07/13  1724 History   First MD Initiated Contact with Patient 10/07/13 1737     Chief Complaint  Patient presents with  . Abnormal Lab      HPI Pt was seen at 1750. Per pt and his wife, c/o gradual onset and worsening of persistent generalized weakness/fatigue and lightheadedness for the past week. Pt states he was evaluated by his PMD today, had labs drawn, then was called and told to come to the ED for further evaluation for "a Hgb of 5." Endorses hx of chronic GI bleeding and "black stools" due to "my iron pills." Denies CP/palpitations, no SOB/cough, no abd pain, no N/V/D, no blood in stools or urine.    Past Medical History  Diagnosis Date  . 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 Hypertension   . Cancer 10/2010    right kidney cancer  . Blood transfusion   . GERD (gastroesophageal reflux disease)   . Headache(784.0)   . Arthritis     generalized  . Depression   . Pneumonia     DEC 2012 HOSP AT [facility]  . Sleep apnea     TEST WAS YRS AGO-DID USE CPAP FOR A WHILE--BUT NO LONGER USES--AND DOES NOT FEEL SLEEPY DURING THE DAY TIME ANYMORE  . 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  [name] Anemia     DEC 2012 -REQUIRED TRANSFUSIONS AND ANEMIA AND TRANSFUSIONS AGAIN FEB 2013--DX WITH "GAVE"  . Colon polyps     10/2012  . Tubular adenoma of colon     10/2012  . Angiodysplasia of intestine   . GI bleeding    Past Surgical History  Procedure Laterality Date  . Laparoscopic nephrectomy  10/2010    total   . Foot surgery  2000    both foot sx for bunion  . Varicose vein surgery  1986  . Inner ear surgery  1984    right  ear  . Hernia repair  1954    left   . Esophagogastroduodenoscopy  03/23/2011    Procedure: ESOPHAGOGASTRODUODENOSCOPY (EGD);  Surgeon: Zenovia Jarred,  MD;  Location: [facility];  Service: Gastroenterology;  Laterality: N/A;  . Givens capsule study  03/24/2011    Procedure: GIVENS CAPSULE STUDY;  Surgeon: Zenovia Jarred, MD;  Location: [facility];  Service: Gastroenterology;  Laterality: N/A;  . Colonoscopy N/A 10/29/2012    Procedure: COLONOSCOPY;  Surgeon: Irene Shipper, MD;  Location: [facility];  Service: Endoscopy;  Laterality: N/A;  . Esophagogastroduodenoscopy N/A 10/29/2012    Procedure: ESOPHAGOGASTRODUODENOSCOPY (EGD);  Surgeon: Irene Shipper, MD;  Location: [facility];  Service: Endoscopy;  Laterality: N/A;  . Hot hemostasis N/A 10/29/2012    Procedure: HOT HEMOSTASIS (ARGON PLASMA COAGULATION/BICAP);  Surgeon: Irene Shipper, MD;  Location: [facility];  Service: Endoscopy;  Laterality: N/A;   Family History  Problem Relation Age of Onset  . Hypertension Father   . Breast cancer Sister    History  Substance Use Topics  . Smoking status: Former Smoker -- 1.00 packs/day for 25 years    Types: Cigarettes    Quit date: 02/14/1998  . Smokeless tobacco: Never Used  . Alcohol Use: Yes     Comment: 2 nights a week, not much though    Review  of Systems ROS: Statement: All systems negative except as marked or noted in the HPI; Constitutional: Negative for fever and chills. +generalized weakness/fatigue, lightheadedness.; ; Eyes: Negative for eye pain, redness and discharge. ; ; ENMT: Negative for ear pain, hoarseness, nasal congestion, sinus pressure and sore throat. ; ; Cardiovascular: Negative for chest pain, palpitations, diaphoresis, dyspnea and peripheral edema. ; ; Respiratory: Negative for cough, wheezing and stridor. ; ; Gastrointestinal: Negative for nausea, vomiting, diarrhea, abdominal pain, blood in stool, hematemesis, jaundice and rectal bleeding. . ; ; Genitourinary: Negative for dysuria, flank pain and hematuria. ; ; Musculoskeletal: Negative for back pain and neck pain. Negative for swelling and trauma.; ; Skin: Negative for  pruritus, rash, abrasions, blisters, bruising and skin lesion.; ; Neuro: Negative for headache and neck stiffness. Negative for altered level of consciousness , altered mental status, extremity weakness, paresthesias, involuntary movement, seizure and syncope.      Allergies  Sulfa antibiotics and Tetracyclines & related  Home Medications   Prior to Admission medications   Medication Sig Start Date End Date Taking? Authorizing Provider  aspirin 81 MG chewable tablet Chew 81 mg by mouth daily.   Yes Historical Provider, MD  clonazePAM (KLONOPIN) 0.5 MG tablet Take 0.5 mg by mouth 3 (three) times daily as needed for anxiety.   Yes Historical Provider, MD  escitalopram (LEXAPRO) 10 MG tablet Take 20 mg by mouth daily before breakfast.    Yes Historical Provider, MD  finasteride (PROSCAR) 5 MG tablet Take 5 mg by mouth daily.   Yes Historical Provider, MD  folic acid (FOLVITE) 937 MCG tablet Take 800 mcg by mouth daily.   Yes Historical Provider, MD  losartan (COZAAR) 50 MG tablet Take 50 mg by mouth daily.   Yes Historical Provider, MD  Milnacipran (SAVELLA) 50 MG TABS tablet Take 100 mg by mouth 2 (two) times daily.   Yes Historical Provider, MD  Omega-3 Fatty Acids (FISH OIL) 1200 MG CAPS Take 1,200 mg by mouth daily.   Yes Historical Provider, MD  simvastatin (ZOCOR) 40 MG tablet Take 40 mg by mouth every morning.    Yes Historical Provider, MD  tamsulosin (FLOMAX) 0.4 MG CAPS capsule Take 0.4 mg by mouth daily after supper.   Yes Historical Provider, MD  traMADol (ULTRAM) 50 MG tablet Take 50 mg by mouth every 8 (eight) hours as needed. pain   Yes Historical Provider, MD   BP 113/61  Pulse 76  Temp(Src) 98.2 F (36.8 C) (Oral)  Resp 12  SpO2 98% Physical Exam 1755: Physical examination:  Nursing notes reviewed; Vital signs and O2 SAT reviewed;  Constitutional: Well developed, Well nourished, Well hydrated, In no acute distress; Head:  Normocephalic, atraumatic; Eyes: EOMI, PERRL, No  scleral icterus. Conjunctiva pale.; ENMT: Mouth and pharynx normal, Mucous membranes moist; Neck: Supple, Full range of motion, No lymphadenopathy; Cardiovascular: Regular rate and rhythm, No gallop; Respiratory: Breath sounds clear & equal bilaterally, No rales, rhonchi, wheezes.  Speaking full sentences with ease, Normal respiratory effort/excursion; Chest: Nontender, Movement normal; Abdomen: Soft, Nontender, Nondistended, Normal bowel sounds. Rectal exam performed w/permission of pt and ED RN chaperone present.  Anal tone normal.  Non-tender, soft black stool in rectal vault, heme positive.  No fissures, no external hemorrhoids, no palp masses.; Genitourinary: No CVA tenderness; Extremities: Pulses normal, No tenderness, No edema, No calf edema or asymmetry.; Neuro: AA&Ox3, Major CN grossly intact.  Speech clear. No gross focal motor or sensory deficits in extremities.; Skin: Color pale, Warm, Dry.  ED Course  Procedures     EKG Interpretation None      MDM  MDM Reviewed: previous chart, nursing note and vitals Reviewed previous: labs Interpretation: labs Total time providing critical care: 30-74 minutes. This excludes time spent performing separately reportable procedures and services. Consults: admitting MD   CRITICAL CARE Performed by: Alfonzo Feller Total critical care time: 35 Critical care time was exclusive of separately billable procedures and treating other patients. Critical care was necessary to treat or prevent imminent or life-threatening deterioration. Critical care was time spent personally by me on the following activities: development of treatment plan with patient and/or surrogate as well as nursing, discussions with consultants, evaluation of patient's response to treatment, examination of patient, obtaining history from patient or surrogate, ordering and performing treatments and interventions, ordering and review of laboratory studies, ordering and review of  radiographic studies, pulse oximetry and re-evaluation of patient's condition.    Results for orders placed during the hospital encounter of 41/96/22  BASIC METABOLIC PANEL      Result Value Ref Range   Sodium 136 (*) 137 - 147 mEq/L   Potassium 4.0  3.7 - 5.3 mEq/L   Chloride 99  96 - 112 mEq/L   CO2 25  19 - 32 mEq/L   Glucose, Bld 104 (*) 70 - 99 mg/dL   BUN 24 (*) 6 - 23 mg/dL   Creatinine, Ser 1.43 (*) 0.50 - 1.35 mg/dL   Calcium 8.9  8.4 - 10.5 mg/dL   GFR calc non Af Amer 50 (*) >90 mL/min   GFR calc Af Amer 58 (*) >90 mL/min   Anion gap 12  5 - 15  CBC WITH DIFFERENTIAL      Result Value Ref Range   WBC 5.2  4.0 - 10.5 K/uL   RBC 2.08 (*) 4.22 - 5.81 MIL/uL   Hemoglobin 6.0 (*) 13.0 - 17.0 g/dL   HCT 18.5 (*) 39.0 - 52.0 %   MCV 88.9  78.0 - 100.0 fL   MCH 28.8  26.0 - 34.0 pg   MCHC 32.4  30.0 - 36.0 g/dL   RDW 12.4  11.5 - 15.5 %   Platelets 204  150 - 400 K/uL   Neutrophils Relative % 64  43 - 77 %   Neutro Abs 3.3  1.7 - 7.7 K/uL   Lymphocytes Relative 26  12 - 46 %   Lymphs Abs 1.3  0.7 - 4.0 K/uL   Monocytes Relative 7  3 - 12 %   Monocytes Absolute 0.4  0.1 - 1.0 K/uL   Eosinophils Relative 3  0 - 5 %   Eosinophils Absolute 0.1  0.0 - 0.7 K/uL   Basophils Relative 1  0 - 1 %   Basophils Absolute 0.0  0.0 - 0.1 K/uL  PROTIME-INR      Result Value Ref Range   Prothrombin Time 12.2  11.6 - 15.2 seconds   INR 0.90  0.00 - 1.49  POC OCCULT BLOOD, ED      Result Value Ref Range   Fecal Occult Bld POSITIVE (*) NEGATIVE  TYPE AND SCREEN      Result Value Ref Range   ABO/RH(D) O POS     Antibody Screen NEG     Sample Expiration 10/10/2013     Unit Number [id number]     Blood Component Type RED CELLS,LR     Unit division 00     Status of Unit ALLOCATED  Transfusion Status OK TO TRANSFUSE     Crossmatch Result Compatible     Unit Number [id number]     Blood Component Type RED CELLS,LR     Unit division 00     Status of Unit ALLOCATED      Transfusion Status OK TO TRANSFUSE     Crossmatch Result Compatible    PREPARE RBC (CROSSMATCH)      Result Value Ref Range   Order Confirmation ORDER PROCESSED BY BLOOD BANK      1855:  Pt is orthostatic; judicious IVF given. PRBC transfusion ordered. Pt continues to deny CP/abd pain.  Dx and testing d/w pt and family.  Questions answered.  Verb understanding, agreeable to admit. T/C to [facility]. Cruzita Lederer, case discussed, including:  HPI, pertinent PM/SHx, VS/PE, dx testing, ED course and treatment:  Agreeable to admit, requests to write temporary orders, obtain medical bed to team 8.     Francine Graven, DO 10/09/13 [phone]

## 2013-10-08 DIAGNOSIS — K900 Celiac disease: Secondary | ICD-10-CM

## 2013-10-08 LAB — BASIC METABOLIC PANEL
Anion gap: 8 (ref 5–15)
BUN: 25 mg/dL — ABNORMAL HIGH (ref 6–23)
CO2: 27 mEq/L (ref 19–32)
Calcium: 8.2 mg/dL — ABNORMAL LOW (ref 8.4–10.5)
Chloride: 103 mEq/L (ref 96–112)
Creatinine, Ser: 1.49 mg/dL — ABNORMAL HIGH (ref 0.50–1.35)
GFR calc Af Amer: 55 mL/min — ABNORMAL LOW (ref >90)
GFR calc non Af Amer: 48 mL/min — ABNORMAL LOW (ref >90)
Glucose, Bld: 95 mg/dL (ref 70–99)
Potassium: 4.3 mEq/L (ref 3.7–5.3)
Sodium: 138 mEq/L (ref 137–147)

## 2013-10-08 LAB — CBC
HCT: 18.9 % — ABNORMAL LOW (ref 39.0–52.0)
Hemoglobin: 6.6 g/dL — CL (ref 13.0–17.0)
MCH: 30.7 pg (ref 26.0–34.0)
MCHC: 34.9 g/dL (ref 30.0–36.0)
MCV: 87.9 fL (ref 78.0–100.0)
Platelets: 156 10*3/uL (ref 150–400)
RBC: 2.15 MIL/uL — ABNORMAL LOW (ref 4.22–5.81)
RDW: 12.7 % (ref 11.5–15.5)
WBC: 4.7 10*3/uL (ref 4.0–10.5)

## 2013-10-08 LAB — OCCULT BLOOD X 1 CARD TO LAB, STOOL: Fecal Occult Bld: NEGATIVE

## 2013-10-08 LAB — PREPARE RBC (CROSSMATCH)

## 2013-10-08 MED ORDER — PANTOPRAZOLE SODIUM 40 MG PO TBEC
40.0000 mg | DELAYED_RELEASE_TABLET | Freq: Every day | ORAL | Status: DC
  Administered 2013-10-08 – 2013-10-09 (×2): 40 mg via ORAL
  Filled 2013-10-08 (×3): qty 1

## 2013-10-08 MED ORDER — MILNACIPRAN HCL 50 MG PO TABS
100.0000 mg | ORAL_TABLET | Freq: Two times a day (BID) | ORAL | Status: DC
  Administered 2013-10-08 – 2013-10-09 (×2): 100 mg via ORAL
  Filled 2013-10-08 (×3): qty 2

## 2013-10-08 MED ORDER — DIPHENHYDRAMINE HCL 25 MG PO CAPS
25.0000 mg | ORAL_CAPSULE | Freq: Once | ORAL | Status: AC
  Administered 2013-10-08: 25 mg via ORAL
  Filled 2013-10-08: qty 1

## 2013-10-08 MED ORDER — FERROUS SULFATE 325 (65 FE) MG PO TABS
325.0000 mg | ORAL_TABLET | Freq: Two times a day (BID) | ORAL | Status: DC
  Administered 2013-10-08 – 2013-10-09 (×2): 325 mg via ORAL
  Filled 2013-10-08 (×4): qty 1

## 2013-10-08 MED ORDER — SODIUM CHLORIDE 0.9 % IV SOLN: Freq: Once | INTRAVENOUS | Status: DC

## 2013-10-08 MED ORDER — ACETAMINOPHEN 325 MG PO TABS
650.0000 mg | ORAL_TABLET | Freq: Once | ORAL | Status: AC
  Administered 2013-10-08: 650 mg via ORAL
  Filled 2013-10-08: qty 2

## 2013-10-08 MED ORDER — ACETAMINOPHEN 650 MG RE SUPP: 650.0000 mg | RECTAL | Status: DC | PRN

## 2013-10-08 MED ORDER — FUROSEMIDE 10 MG/ML IJ SOLN
20.0000 mg | Freq: Once | INTRAMUSCULAR | Status: AC
  Administered 2013-10-08: 20 mg via INTRAVENOUS
  Filled 2013-10-08: qty 2

## 2013-10-08 NOTE — Progress Notes (Signed)
Note: This document was prepared with digital dictation and possible smart phrase technology. Any transcriptional errors that result from this process are unintentional.   George Jacobson KDX:[id number] DOB: 01/27/1951 DOA: 10/07/2013 PCP: [name], MD  Brief narrative: 63 y/o ?  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 spure on Endo, colon=small adenoma admitted from PCP office with Hb 5, non-compliant on Iron replacement at request of nephrologist  Past medical history-As per Problem list Chart reviewed as below-   Consultants:  Gastroenterology  Procedures:  None  Antibiotics:  None   Subjective  Feels better than he did this morning as he had a headache without eating. No further dark stools but has been having blood mixed with stool for the past couple of days No problems with diet in terms of nausea or other issues   Objective    Interim History: None  Telemetry: None   Objective: Filed Vitals:   10/08/13 0350 10/08/13 0415 10/08/13 0551 10/08/13 0900  BP: 116/59 111/59  125/70  Pulse: 81 79  79  Temp: 98.9 F (37.2 C) 99.3 F (37.4 C) 98.9 F (37.2 C) 98.7 F (37.1 C)  TempSrc: Oral Oral  Oral  Resp: 15 15  16   Height:      Weight:      SpO2: 97% 97%  97%    Intake/Output Summary (Last 24 hours) at 10/08/13 1207 Last data filed at 10/08/13 9767  Gross per 24 hour  Intake 1752.91 ml  Output   1125 ml  Net 627.91 ml    Exam:  General: Alert pleasant oriented no apparent distress Cardiovascular: S1-S2 no murmur rub or gallop Respiratory: Clear Abdomen: Soft nontender no distention Skin no lower extremity edema Neuro intact  Data Reviewed: Basic Metabolic Panel:  Recent Labs Lab 10/07/13 1803 10/08/13 0625  NA 136* 138  K 4.0 4.3  CL 99 103  CO2 25 27  GLUCOSE 104* 95  BUN 24* 25*  CREATININE 1.43* 1.49*  CALCIUM 8.9  8.2*   Liver Function Tests: No results found for this basename: AST, ALT, ALKPHOS, BILITOT, PROT, ALBUMIN,  in the last 168 hours No results found for this basename: LIPASE, AMYLASE,  in the last 168 hours No results found for this basename: AMMONIA,  in the last 168 hours CBC:  Recent Labs Lab 10/07/13 1803 10/08/13 0625  WBC 5.2 4.7  NEUTROABS 3.3  --   HGB 6.0* 6.6*  HCT 18.5* 18.9*  MCV 88.9 87.9  PLT 204 156   Cardiac Enzymes: No results found for this basename: CKTOTAL, CKMB, CKMBINDEX, TROPONINI,  in the last 168 hours BNP: No components found with this basename: POCBNP,  CBG: No results found for this basename: GLUCAP,  in the last 168 hours  No results found for this or any previous visit (from the past 240 hour(s)).   Studies:              All Imaging reviewed and is as per above notation   Scheduled Meds: . sodium chloride   Intravenous Once  . acetaminophen  650 mg Oral Once  . diphenhydrAMINE  25 mg Oral Once  . escitalopram  20 mg Oral QAC breakfast  . ferrous sulfate  325 mg Oral BID WC  . finasteride  5 mg Oral Daily  . furosemide  20 mg Intravenous Once  . pantoprazole  40 mg Oral Q0600  .  tamsulosin  0.4 mg Oral QPC supper   Continuous Infusions: . sodium chloride 75 mL/hr at 10/08/13 1119  . sodium chloride       Assessment/Plan: 1. 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 His hemoglobin stabilized in the 9 range, agree he may be able to discharge-will probably need a chronic 40 mg twice a day but defer decision-making to gastroenterology. Agree with continuing iron twice a day needs reticulocyte count CBC in a week 2. Celiac disease-patient compliant on diet. Recommend continuation of the same 3. History of nephrectomy for urothelial cancer-stable monitor 4. Prostatic hyperplasia continue tamsulosin 0.4 q.  PM 5. Depression continue Lexapro 20 mg daily  Code Status: Full Family Communication: None bedside Disposition Plan: Inpatient, but probably can discharge home a.m.   Verneita Griffes, MD  [facility] Pager [phone] 10/08/2013, 12:07 PM    LOS: 1 day

## 2013-10-08 NOTE — Consult Note (Signed)
Referring Provider: [facility] Primary Care Physician:  [name], MD Primary Gastroenterologist:  Dr. Hilarie Fredrickson  Reason for Consultation:  GI bleed and anemia  HPI: George Jacobson is a 63 y.o. male  with a past medical history of urothelial cancer status post nephrectomy, hypertension, and iron deficiency anemia felt secondary to GI blood loss and sprue.[name] He is known to Dr Hilarie Fredrickson from 2013 when he was hospitalized for heme-positive stool and severe anemia requiring blood transfusion. At that time he underwent an upper endoscopy which revealed antral gastritis, question  GAVE. Capsule endoscopy was also performed which was negative except for one very small AVM at 2 hours. He was treated with iron replacement and serial blood counts. He was hospitalized again in September 2014 and seen by Dr. Henrene Pastor. During this hospitalization he was found to have a significant iron deficiency anemia requiring blood transfusion. He was not having any evidence of GI blood loss including no melena or hematochezia. During this hospitalization he was heme-negative but underwent EGD / colonoscopy. Biopsies of the small bowel were performed. Colonoscopy revealed normal terminal ileum and a diminutive adenoma was removed from descending colon. There was mild sigmoid diverticulosis. Gastric biopsies revealed intraepithelial lymphocytosis and mild villous blunting raising the question of celiac disease.  He was seen by his PCP yesterday and had blood work that revealed a hgb of 5 and advised to go to ER for admission. He has been feeling weak for about 1-1 1/2 weeks. He denies any frank bleeding but had had some dark stools for several days prior to admission. . Denies chest pain, nausea/vomiting or diarrhea. He has had no fevers or chills.  Takes daily baby aspirin.  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 He has  been adhering to a gluten free diet for a year. Pt says he had been using his iron regularly, but 5-6 months ago nephrology told him he could discontinue it. He restarted his iron about a week or so ago when he began feeling weak.    Past Medical History  Diagnosis Date  . 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 Hypertension   . Cancer 10/2010    right kidney cancer  . Blood transfusion   . GERD (gastroesophageal reflux disease)   . Headache(784.0)   . Arthritis     generalized  . Depression   . Pneumonia     DEC 2012 HOSP AT [place]  . Sleep apnea     TEST WAS YRS AGO-DID USE CPAP FOR A WHILE--BUT NO LONGER USES--AND DOES NOT FEEL SLEEPY DURING THE DAY TIME ANYMORE  . 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  [name] Anemia     DEC 2012 -REQUIRED TRANSFUSIONS AND ANEMIA AND TRANSFUSIONS AGAIN FEB 2013--DX WITH "GAVE"  . Colon polyps     10/2012  . Tubular adenoma of colon     10/2012  . Angiodysplasia of intestine     Past Surgical History  Procedure Laterality Date  . Laparoscopic nephrectomy  10/2010    total   . Foot surgery  2000    both foot sx for bunion  . Varicose vein surgery  1986  . Inner ear surgery  1984    right  ear  . Hernia repair  1954    left   . Esophagogastroduodenoscopy  03/23/2011    Procedure: ESOPHAGOGASTRODUODENOSCOPY (EGD);  Surgeon: Zenovia Jarred, MD;  Location: [facility];  Service: Gastroenterology;  Laterality: N/A;  . Givens capsule study  03/24/2011    Procedure: GIVENS CAPSULE STUDY;  Surgeon: Zenovia Jarred, MD;  Location: [facility];  Service: Gastroenterology;  Laterality: N/A;  . Colonoscopy N/A 10/29/2012    Procedure: COLONOSCOPY;  Surgeon: Irene Shipper, MD;  Location: [facility];  Service: Endoscopy;  Laterality: N/A;  . Esophagogastroduodenoscopy N/A 10/29/2012     Procedure: ESOPHAGOGASTRODUODENOSCOPY (EGD);  Surgeon: Irene Shipper, MD;  Location: [facility];  Service: Endoscopy;  Laterality: N/A;  . Hot hemostasis N/A 10/29/2012    Procedure: HOT HEMOSTASIS (ARGON PLASMA COAGULATION/BICAP);  Surgeon: Irene Shipper, MD;  Location: [facility];  Service: Endoscopy;  Laterality: N/A;    Prior to Admission medications   Medication Sig Start Date End Date Taking? Authorizing Provider  aspirin 81 MG chewable tablet Chew 81 mg by mouth daily.   Yes Historical Provider, MD  clonazePAM (KLONOPIN) 0.5 MG tablet Take 0.5 mg by mouth 3 (three) times daily as needed for anxiety.   Yes Historical Provider, MD  escitalopram (LEXAPRO) 10 MG tablet Take 20 mg by mouth daily before breakfast.    Yes Historical Provider, MD  finasteride (PROSCAR) 5 MG tablet Take 5 mg by mouth daily.   Yes Historical Provider, MD  folic acid (FOLVITE) 629 MCG tablet Take 800 mcg by mouth daily.   Yes Historical Provider, MD  losartan (COZAAR) 50 MG tablet Take 50 mg by mouth daily.   Yes Historical Provider, MD  Milnacipran (SAVELLA) 50 MG TABS tablet Take 100 mg by mouth 2 (two) times daily.   Yes Historical Provider, MD  Omega-3 Fatty Acids (FISH OIL) 1200 MG CAPS Take 1,200 mg by mouth daily.   Yes Historical Provider, MD  simvastatin (ZOCOR) 40 MG tablet Take 40 mg by mouth every morning.    Yes Historical Provider, MD  tamsulosin (FLOMAX) 0.4 MG CAPS capsule Take 0.4 mg by mouth daily after supper.   Yes Historical Provider, MD  traMADol (ULTRAM) 50 MG tablet Take 50 mg by mouth every 8 (eight) hours as needed. pain   Yes Historical Provider, MD    Current Facility-Administered Medications  Medication Dose Route Frequency Provider Last Rate Last Dose  . 0.9 %  sodium chloride infusion   Intravenous Continuous Francine Graven, DO 75 mL/hr at 10/07/13 2013    . 0.9 %  sodium chloride infusion   Intravenous Continuous Costin Karlyne Greenspan, MD      . acetaminophen (TYLENOL) suppository  650 mg  650 mg Rectal Q4H PRN Rhetta Mura Schorr, NP      . clonazePAM Bobbye Charleston) tablet 0.5 mg  0.5 mg Oral TID PRN Caren Griffins, MD   0.5 mg at 10/07/13 2140  . escitalopram (LEXAPRO) tablet 20 mg  20 mg Oral QAC breakfast Costin Karlyne Greenspan, MD      . finasteride (PROSCAR) tablet 5 mg  5 mg Oral Daily Costin Karlyne Greenspan, MD      . pantoprazole (PROTONIX) injection 40 mg  40 mg Intravenous Q12H Caren Griffins, MD   40 mg at 10/07/13 2140  . tamsulosin (FLOMAX) capsule 0.4 mg  0.4 mg Oral QPC supper Caren Griffins, MD        Allergies as of 10/07/2013 -  Review Complete 10/07/2013  Allergen Reaction Noted  . Sulfa antibiotics  03/21/2011  . Tetracyclines & related  03/21/2011    Family History  Problem Relation Age of Onset  . Hypertension Father   . Breast cancer Sister     History   Social History  . Marital Status: Married    Spouse Name: N/A    Number of Children: 2  . Years of Education: N/A   Occupational History  . OWNER    Social History Main Topics  . Smoking status: Former Smoker -- 1.00 packs/day for 25 years    Types: Cigarettes    Quit date: 02/14/1998  . Smokeless tobacco: Never Used  . Alcohol Use: Yes     Comment: 2 nights a week, not much though  . Drug Use: No  . Sexual Activity: Not on file    Social History Narrative   Lives in [place] with his wife, has two distributing businesses and at baseline still quite active. Semi-retired. Have two children, and 2 grandchildren. No cane or walker. [name] = wife.     Review of Systems: Gen: Denies any fever, chills, sweats, anorexia, fatigue, weakness, malaise, weight loss, and sleep disorder CV: Denies chest pain, angina, palpitations, syncope, orthopnea, PND, peripheral edema, and claudication. Resp: Denies dyspnea at rest, dyspnea with exercise, cough, sputum, wheezing, coughing up blood, and pleurisy. GI: Denies vomiting blood, jaundice, and fecal incontinence.   Denies dysphagia or  odynophagia. GU : Denies urinary burning, blood in urine, urinary frequency, urinary hesitancy, nocturnal urination, and urinary incontinence. MS: Denies joint pain, limitation of movement, and swelling, stiffness, low back pain, extremity pain. Denies muscle weakness, cramps, atrophy.  Derm: Denies rash, itching, dry skin, hives, moles, warts, or unhealing ulcers.  Psych: Denies depression, anxiety, memory loss, suicidal ideation, hallucinations, paranoia, and confusion. Heme: Denies bruising, bleeding, and enlarged lymph nodes. Neuro:  Denies any headaches, dizziness, paresthesias. Endo:  Denies any problems with DM, thyroid, adrenal function.  Physical Exam: Vital signs in last 24 hours: Temp:  [98 F (36.7 C)-99.3 F (37.4 C)] 98.7 F (37.1 C) (08/25 0900) Pulse Rate:  [72-96] 79 (08/25 0900) Resp:  [12-20] 16 (08/25 0900) BP: (98-126)/(50-70) 125/70 mmHg (08/25 0900) SpO2:  [94 %-100 %] 97 % (08/25 0900) Weight:  [169 lb 5 oz (76.8 kg)] 169 lb 5 oz (76.8 kg) (08/24 2000)   General:   Alert,  Well-developed, well-nourished, pleasant and cooperative in NAD Head:  Normocephalic and atraumatic. Eyes:  Sclera clear, no icterus.   Conjunctiva pink. Ears:  Normal auditory acuity. Nose:  No deformity, discharge,  or lesions. Mouth:  No deformity or lesions.   Neck:  Supple; no masses or thyromegaly. Lungs:  Clear throughout to auscultation.   No wheezes, crackles, or rhonchi.  Heart:  Regular rate and rhythm; no murmurs, clicks, rubs,  or gallops. Abdomen:  Soft,nontender, BS active,nonpalp mass or hsm.   Rectal:  Scant brown stool, ifob obtained and sent to lab Msk:  Symmetrical without gross deformities. . Pulses:  Normal pulses noted. Extremities:  Without clubbing or edema. Neurologic:  Alert and  oriented x4;  grossly normal neurologically. Skin:  Intact without significant lesions or rashes.. Psych:  Alert and cooperative. Normal mood and affect.  Intake/Output from previous  day: 08/24 0701 - 08/25 0700 In: 1752.9 [P.O.:240; I.V.:825; Blood:687.9] Out: 425 [Urine:425] Intake/Output this shift: Total I/O In: -  Out: 700 [Urine:700]  Lab Results:  Recent Labs  10/07/13 1803 10/08/13 0625  WBC  5.2 4.7  HGB 6.0* 6.6*  HCT 18.5* 18.9*  PLT 204 156   BMET  Recent Labs  10/07/13 1803 10/08/13 0625  NA 136* 138  K 4.0 4.3  CL 99 103  CO2 25 27  GLUCOSE 104* 95  BUN 24* 25*  CREATININE 1.43* 1.49*  CALCIUM 8.9 8.2*    PT/INR  Recent Labs  10/07/13 1803  LABPROT 12.2  INR 0.90     Studies/Results:  Colonoscopy and  EGD done 10/2012--see HPI.  IMPRESSION/PLAN:  63 yo male admitted with orthostasis, anemia and heme positive stools. Pt was heme positive yesterday and anemic but has not been on his iron supplementation . 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 Patient had been off iron for 5-6 months. His heme positve stools may be due to AVMs noted on prior pill endo. No plans for repeat endoscopic workup for now. Will follow h/h. If hgb stabilizes post transfusion,  will follow in office in 2 months with repeat cbc one week prior to visit. If his h/h does not improve with the transfusion, further endoscopic eval will be considered   Vita Barley Hvozdovic PA-C  10/08/2013, 9:46 AM   ________________________________________________________________________  Velora Heckler GI MD note:  I personally examined the patient, reviewed the data and agree with the assessment and plan described above.  He has had EGD, Colonoscopy (10/2012 for IDA when Hb was 6.9 at it's lowest) capsule endo 2013.  He should have been taking iron daily but stopped several months ago.  His dark stools recently started after he restarted iron a few days ago.  My suspicion for signficant underlying pathology is low.  He will be restarted on daily iron (was tolerating twice daily iron previously and so that is what I have restarted), PPI once daily.  If Hb bumps  appropriately after next 2 units blood then he is safe to go home tomorrow. We will arrange for follow up appt in 6-8 weeks with CBC a few days prior.  I've ordered regular diet as well.   Owens Loffler, MD [facility] Gastroenterology Pager [phone]

## 2013-10-08 NOTE — Care Management Note (Signed)
CARE MANAGEMENT NOTE 10/08/2013  Patient:  George Jacobson, George Jacobson   Account Number:  [id number]  Date Initiated:  10/08/2013  Documentation initiated by:  Leafy Kindle  Subjective/Objective Assessment:   63 yo admitted with weakness, GI bleed and anemia.  Hx of urothelial cancer status post right nephrectomy 2012     Action/Plan:   From home with spouse   Anticipated DC Date:  10/11/2013   Anticipated DC Plan:  [place]  CM consult      Choice offered to / List presented to:             Status of service:  In process, will continue to follow Medicare Important Message given?   (If response is "NO", the following Medicare IM given date fields will be blank) Date Medicare IM given:   Medicare IM given by:   Date Additional Medicare IM given:   Additional Medicare IM given by:    Discharge Disposition:    Per UR Regulation:  Reviewed for med. necessity/level of care/duration of stay  If discussed at [place] of Stay Meetings, dates discussed:    Comments:  10/08/13 Marney Doctor RN,BSN,NCM Pt admitted with weakness and anemia.  Pt receiving blood transfusions and is being followed by GI.  CM will follow for DC needs.

## 2013-10-09 ENCOUNTER — Other Ambulatory Visit: Payer: Self-pay | Admitting: Nurse Practitioner

## 2013-10-09 DIAGNOSIS — D509 Iron deficiency anemia, unspecified: Secondary | ICD-10-CM

## 2013-10-09 LAB — TYPE AND SCREEN
ABO/RH(D): O POS
Antibody Screen: NEGATIVE
Unit division: 0
Unit division: 0
Unit division: 0
Unit division: 0

## 2013-10-09 LAB — CBC
HCT: 23.6 % — ABNORMAL LOW (ref 39.0–52.0)
Hemoglobin: 8.1 g/dL — ABNORMAL LOW (ref 13.0–17.0)
MCH: 30.8 pg (ref 26.0–34.0)
MCHC: 34.3 g/dL (ref 30.0–36.0)
MCV: 89.7 fL (ref 78.0–100.0)
Platelets: 136 10*3/uL — ABNORMAL LOW (ref 150–400)
RBC: 2.63 MIL/uL — ABNORMAL LOW (ref 4.22–5.81)
RDW: 13.6 % (ref 11.5–15.5)
WBC: 4.5 10*3/uL (ref 4.0–10.5)

## 2013-10-09 MED ORDER — PANTOPRAZOLE SODIUM 40 MG PO TBEC: 40.0000 mg | DELAYED_RELEASE_TABLET | Freq: Every day | ORAL | Status: DC

## 2013-10-09 NOTE — Clinical Documentation Improvement (Signed)
Possible clarify clinical conditions?   _______CKD Stage I - GFR > OR = 90 _______CKD Stage II - GFR 60-80 _______CKD Stage III - GFR 30-59 _______CKD Stage IV - GFR 15-29 _______CKD Stage V - GFR < 15 _______ESRD (End Stage Renal Disease) _______Other condition_____________ _______Cannot Clinically determine   Please also indicate if the kidney disease stage has progressed during stay  Component     Latest Ref Rng 10/07/2013 10/08/2013  Glucose     70 - 99 mg/dL 104 (H) 95  BUN     6 - 23 mg/dL 24 (H) 25 (H)  Creatinine     0.50 - 1.35 mg/dL 1.43 (H) 1.49 (H)  GFR calc non Af Amer     >90 mL/min 50 (L) 48 (L)   Documentation in notes: H&P 10/07/13 "Assessment/Plan : Active Problems:Renal insufficiency                                #3 CKD - patient's renal function is at baseline, repeat BMP in am"  Risk factors:Urothelial cancer  Right renal pelvis s/p right nephroureterectomy,right kidney cancer , HTN,   Treatment: Monitor BMP, IVF, Monitor I&O, Daily wt.  Thank You, Philippa Chester ,RN Clinical Documentation Specialist:  [facility] Information Management

## 2013-10-09 NOTE — Progress Notes (Signed)
Patient discharge home with wife, alert and oriented, discharge instructions given, patient verbalize understanding of discharge instructions given, My Chart access declined at this time, patient instable condition at this time

## 2013-10-09 NOTE — Discharge Instructions (Signed)
Please come to Santina [facility] Dr Pyrtle's office 1 week prior to appt for labs.  Go to lab in basement.Gastrointestinal Bleeding Gastrointestinal (GI) bleeding means there is bleeding somewhere along the digestive tract, between the mouth and anus. CAUSES  There are many different problems that can cause GI bleeding. Possible causes include:  Esophagitis. This is inflammation, irritation, or swelling of the esophagus.  Hemorrhoids.These are veins that are full of blood (engorged) in the rectum. They cause pain, inflammation, and may bleed.  Anal fissures.These are areas of painful tearing which may bleed. They are often caused by passing hard stool.  Diverticulosis.These are pouches that form on the colon over time, with age, and may bleed significantly.  Diverticulitis.This is inflammation in areas with diverticulosis. It can cause pain, fever, and bloody stools, although bleeding is rare.  Polyps and cancer. Colon cancer often starts out as precancerous polyps.  Gastritis and ulcers.Bleeding from the upper gastrointestinal tract (near the stomach) may travel through the intestines and produce black, sometimes tarry, often bad smelling stools. In certain cases, if the bleeding is fast enough, the stools may not be black, but red. This condition may be life-threatening. SYMPTOMS   Vomiting bright red blood or material that looks like coffee grounds.  Bloody, black, or tarry stools. DIAGNOSIS  Your caregiver may diagnose your condition by taking your history and performing a physical exam. More tests may be needed, including:  X-rays and other imaging tests.  Esophagogastroduodenoscopy (EGD). This test uses a flexible, lighted tube to look at your esophagus, stomach, and small intestine.  Colonoscopy. This test uses a flexible, lighted tube to look at your colon. TREATMENT  Treatment depends on the cause of your bleeding.   For bleeding from the esophagus, stomach, small  intestine, or colon, the caregiver doing your EGD or colonoscopy may be able to stop the bleeding as part of the procedure.  Inflammation or infection of the colon can be treated with medicines.  Many rectal problems can be treated with creams, suppositories, or warm baths.  Surgery is sometimes needed.  Blood transfusions are sometimes needed if you have lost a lot of blood. If bleeding is slow, you may be allowed to go home. If there is a lot of bleeding, you will need to stay in the hospital for observation. HOME CARE INSTRUCTIONS   Take any medicines exactly as prescribed.  Keep your stools soft by eating foods that are high in fiber. These foods include whole grains, legumes, fruits, and vegetables. Prunes (1 to 3 a day) work well for many people.  Drink enough fluids to keep your urine clear or pale yellow. SEEK IMMEDIATE MEDICAL CARE IF:   Your bleeding increases.  You feel lightheaded, weak, or you faint.  You have severe cramps in your back or abdomen.  You pass large blood clots in your stool.  Your problems are getting worse. MAKE SURE YOU:   Understand these instructions.  Will watch your condition.  Will get help right away if you are not doing well or get worse. Document Released: 01/29/2000 Document Revised: 01/18/2012 Document Reviewed: 01/10/2011 [facility] Patient Information 2015 [place], [place]. This information is not intended to replace advice given to you by your health care provider. Make sure you discuss any questions you have with your health care provider.

## 2013-10-09 NOTE — Discharge Summary (Signed)
Physician Discharge Summary  George Jacobson ZCH:[id number] DOB: 1950-02-24 DOA: 10/07/2013  PCP: [name], MD  Admit date: 10/07/2013 Discharge date: 10/09/2013  Recommendations for Outpatient Follow-up:  1. Pt will need to follow up with PCP in 2-3 weeks post discharge 2. Please obtain BMP to evaluate electrolytes and kidney function 3. Please also check CBC to evaluate Hg and Hct levels 4. Please note that pt was cleared for D/C by Gi team   Discharge Diagnoses:  Active Problems:   Anemia   Renal insufficiency   Heme positive stool   GI bleed  Discharge Condition: Stable  Diet recommendation: Heart healthy diet discussed in details   History of present illness:  63 y/o ? with h/o Nephrectomy for Urolitheial Ca, Iron def anemia 2/2 to spurue/GI blood loss, prior Severe anemia with EGD 2013 --> GAVE/antritis and rpt 2014 -? spure on Endo, colon=small adenoma admitted from PCP office with Hb 5.   Hospital Course:  Active Problems:   Anemia - status ost PRBC transfusion with appropriate increase in post transfusion Hg - pt wants to go home - GI team cleared for D/C    Renal insufficiency - stable   Procedures/Studies:  None  Consultations:  GI  Antibiotics:  None  Discharge Exam: Filed Vitals:   10/09/13 0554  BP: 120/69  Pulse: 69  Temp: 97 F (36.1 C)  Resp: 18   Filed Vitals:   10/08/13 1845 10/08/13 1849 10/08/13 2059 10/09/13 0554  BP: 125/60 166/54 117/66 120/69  Pulse: 77 72 69 69  Temp: 98.3 F (36.8 C) 97.5 F (36.4 C) 98.2 F (36.8 C) 97 F (36.1 C)  TempSrc: Oral Oral Oral Oral  Resp: 18 20 20 18   Height:      Weight:      SpO2: 97% 100% 98% 99%    General: Pt is alert, follows commands appropriately, not in acute distress Cardiovascular: Regular rate and rhythm, S1/S2 +, no murmurs, no rubs, no gallops Respiratory: Clear to auscultation bilaterally, no wheezing, no crackles, no rhonchi Abdominal: Soft, non tender, non  distended, bowel sounds +, no guarding Extremities: no edema, no cyanosis, pulses palpable bilaterally DP and PT Neuro: Grossly nonfocal  Discharge Instructions  Discharge Instructions   Diet - low sodium heart healthy    Complete by:  As directed      Increase activity slowly    Complete by:  As directed             Medication List         aspirin 81 MG chewable tablet  Chew 81 mg by mouth daily.     clonazePAM 0.5 MG tablet  Commonly known as:  KLONOPIN  Take 0.5 mg by mouth 3 (three) times daily as needed for anxiety.     escitalopram 10 MG tablet  Commonly known as:  LEXAPRO  Take 20 mg by mouth daily before breakfast.     finasteride 5 MG tablet  Commonly known as:  PROSCAR  Take 5 mg by mouth daily.     Fish Oil 1200 MG Caps  Take 1,200 mg by mouth daily.     folic acid 287 MCG tablet  Commonly known as:  FOLVITE  Take 800 mcg by mouth daily.     losartan 50 MG tablet  Commonly known as:  COZAAR  Take 50 mg by mouth daily.     Milnacipran 50 MG Tabs tablet  Commonly known as:  SAVELLA  Take 100 mg by mouth  2 (two) times daily.     pantoprazole 40 MG tablet  Commonly known as:  PROTONIX  Take 1 tablet (40 mg total) by mouth daily at 6 (six) AM.     simvastatin 40 MG tablet  Commonly known as:  ZOCOR  Take 40 mg by mouth every morning.     tamsulosin 0.4 MG Caps capsule  Commonly known as:  FLOMAX  Take 0.4 mg by mouth daily after supper.     traMADol 50 MG tablet  Commonly known as:  ULTRAM  Take 50 mg by mouth every 8 (eight) hours as needed. pain            Follow-up Information   Follow up with Jerene Bears, MD On 12/10/2013. (11:00 a.m.)    Specialty:  Gastroenterology   Contact information:   [address] [place] [address] [phone]       Schedule an appointment as soon as possible for a visit with [name], MD.   Specialty:  Family Medicine   Contact information:   [address] [place]  [address] [phone]       Follow up with [name], MD. (As needed, call my cell phone [phone])    Specialty:  Internal Medicine   Contact information:   [address] [place] [address] [phone]        The results of significant diagnostics from this hospitalization (including imaging, microbiology, ancillary and laboratory) are listed below for reference.     Microbiology: No results found for this or any previous visit (from the past 240 hour(s)).   Labs: Basic Metabolic Panel:  Recent Labs Lab 10/07/13 1803 10/08/13 0625  NA 136* 138  K 4.0 4.3  CL 99 103  CO2 25 27  GLUCOSE 104* 95  BUN 24* 25*  CREATININE 1.43* 1.49*  CALCIUM 8.9 8.2*   CBC:  Recent Labs Lab 10/07/13 1803 10/08/13 0625 10/09/13 0515  WBC 5.2 4.7 4.5  NEUTROABS 3.3  --   --   HGB 6.0* 6.6* 8.1*  HCT 18.5* 18.9* 23.6*  MCV 88.9 87.9 89.7  PLT 204 156 136*   SIGNED: Time coordinating discharge: Over 30 minutes  [name], MD  [facility] 10/09/2013, 11:07 AM Pager [phone]  If 7PM-7AM, please contact night-coverage [email or web address] Password [email or web address]

## 2013-10-09 NOTE — Progress Notes (Signed)
     [facility] Progress Note  Subjective:     Restarted on bid iron yesterday. Rec 2 units prbcs yesterday. Hgb this am 8.8. Feels well. No further lightheadedness or weakness. Had a BM last pm that he says was dark.  Objective:  Vital signs in last 24 hours: Temp:  [97 F (36.1 C)-98.8 F (37.1 C)] 97 F (36.1 C) (08/26 0554) Pulse Rate:  [63-85] 69 (08/26 0554) Resp:  [16-20] 18 (08/26 0554) BP: (113-166)/(54-79) 120/69 mmHg (08/26 0554) SpO2:  [94 %-100 %] 99 % (08/26 0554) Last BM Date: 10/08/13 General:   Alert,  Well-developed, male in NAD Heart:  Regular rate and rhythm; no murmurs Pulm: clear to ausc bilat Abdomen:  Soft, nontender and nondistended. Normal bowel sounds, without guarding, and without rebound.   Extremities:  Without edema. Neurologic:  Alert and  oriented x4;  grossly normal neurologically. Psych:  Alert and cooperative. Normal mood and affect    Lab Results:  Recent Labs  10/07/13 1803 10/08/13 0625 10/09/13 0515  WBC 5.2 4.7 4.5  HGB 6.0* 6.6* 8.1*  HCT 18.5* 18.9* 23.6*  PLT 204 156 136*   BMET  Recent Labs  10/07/13 1803 10/08/13 0625  NA 136* 138  K 4.0 4.3  CL 99 103  CO2 25 27  GLUCOSE 104* 95  BUN 24* 25*  CREATININE 1.43* 1.49*  CALCIUM 8.9 8.2*    Assessment / Plan: 63 yo male admitted with orthostasis, anemia and heme positive stools. 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 H/H has improved post transfusion. Will follow in office in 2 months with repeat cbc one week prior to visit. If his H/H has not further improved at that time, further endoscopic eval will be warranted. Pt to continue bid iron supplementation and PPI therapy upon discharge.He is stable from a GI standpoint to go home.    LOS: 2 days   Vita Barley Hvozdovic  10/09/2013, PA-C   ________________________________________________________________________  Velora Heckler GI MD note:  I reviewed  the data and agree with the assessment and plan described above.  He was discharged before I could see him in hospital today. He will be on iron twice daily, rov with Dr. Hilarie Fredrickson in several weeks with CBC a few days prior.     Owens Loffler, MD [facility] Pager [phone]

## 2013-10-09 NOTE — Care Management Note (Signed)
CARE MANAGEMENT NOTE 10/09/2013  Patient:  George Jacobson, George Jacobson   Account Number:  [id number]  Date Initiated:  10/08/2013  Documentation initiated by:  Leafy Kindle  Subjective/Objective Assessment:   63 yo admitted with weakness, GI bleed and anemia.  Hx of urothelial cancer status post right nephrectomy 2012     Action/Plan:   From home with spouse   Anticipated DC Date:  10/11/2013   Anticipated DC Plan:  [place]  CM consult      Choice offered to / List presented to:             Status of service:  In process, will continue to follow Medicare Important Message given?   (If response is "NO", the following Medicare IM given date fields will be blank) Date Medicare IM given:   Medicare IM given by:   Date Additional Medicare IM given:   Additional Medicare IM given by:    Discharge Disposition:    Per UR Regulation:  Reviewed for med. necessity/level of care/duration of stay  If discussed at [place] of Stay Meetings, dates discussed:    Comments:  10/09/13 Marney Doctor RN,BSN,NCM Pt to DC home.  No DC orders or needs.  10/08/13 Marney Doctor RN,BSN,NCM Pt admitted with weakness and anemia.  Pt receiving blood transfusions and is being followed by GI.  CM will follow for DC needs.

## 2013-10-15 DIAGNOSIS — Z5189 Encounter for other specified aftercare: Secondary | ICD-10-CM

## 2013-10-15 HISTORY — DX: Encounter for other specified aftercare: Z51.89

## 2013-10-30 ENCOUNTER — Other Ambulatory Visit: Payer: Self-pay

## 2013-10-30 ENCOUNTER — Telehealth: Payer: Self-pay | Admitting: Internal Medicine

## 2013-10-30 DIAGNOSIS — D649 Anemia, unspecified: Secondary | ICD-10-CM

## 2013-10-30 NOTE — Telephone Encounter (Signed)
Agree, thanks

## 2013-10-30 NOTE — Telephone Encounter (Signed)
Pt states he felt lightheaded yesterday and went to his PCP and had his Hgb checked and it was 7.1. Pt states he is not seeing blood but he feels like he is losing it somewhere. Pt was in the hospital recently and states he was told to take oral iron. Pt is concerned about taking the iron because the nephrologist told him not to take it because he only has 1 kidney. Pt is not scheduled to be seen until 12/10/13. Please advise.

## 2013-10-30 NOTE — Telephone Encounter (Signed)
Per Dr. Henrene Pastor pt to come tomorrow for CBC and iron studies. Pt to have previsit tomorrow at 4:30pm. Pt to have EGD and if negative capsule endo. Pt scheduled for egd in [place] Friday at 11am. Pt aware of appts.

## 2013-10-31 ENCOUNTER — Ambulatory Visit (AMBULATORY_SURGERY_CENTER): Payer: Self-pay | Admitting: *Deleted

## 2013-10-31 ENCOUNTER — Other Ambulatory Visit (INDEPENDENT_AMBULATORY_CARE_PROVIDER_SITE_OTHER): Payer: Medicare Other

## 2013-10-31 VITALS — Ht 69.0 in | Wt 170.2 lb

## 2013-10-31 DIAGNOSIS — D500 Iron deficiency anemia secondary to blood loss (chronic): Secondary | ICD-10-CM

## 2013-10-31 DIAGNOSIS — D649 Anemia, unspecified: Secondary | ICD-10-CM

## 2013-10-31 DIAGNOSIS — D509 Iron deficiency anemia, unspecified: Secondary | ICD-10-CM

## 2013-10-31 LAB — CBC WITH DIFFERENTIAL/PLATELET
Basophils Absolute: 0 10*3/uL (ref 0.0–0.1)
Basophils Relative: 0.5 % (ref 0.0–3.0)
Eosinophils Absolute: 0.1 10*3/uL (ref 0.0–0.7)
Eosinophils Relative: 1.8 % (ref 0.0–5.0)
HCT: 22.5 % — CL (ref 39.0–52.0)
Hemoglobin: 7.6 g/dL — CL (ref 13.0–17.0)
Lymphocytes Relative: 23.9 % (ref 12.0–46.0)
Lymphs Abs: 1 10*3/uL (ref 0.7–4.0)
MCHC: 33.9 g/dL (ref 30.0–36.0)
MCV: 93.1 fl (ref 78.0–100.0)
Monocytes Absolute: 0.4 10*3/uL (ref 0.1–1.0)
Monocytes Relative: 9 % (ref 3.0–12.0)
Neutro Abs: 2.6 10*3/uL (ref 1.4–7.7)
Neutrophils Relative %: 64.8 % (ref 43.0–77.0)
Platelets: 298 10*3/uL (ref 150.0–400.0)
RBC: 2.41 Mil/uL — ABNORMAL LOW (ref 4.22–5.81)
RDW: 16.6 % — ABNORMAL HIGH (ref 11.5–15.5)
WBC: 4 10*3/uL (ref 4.0–10.5)

## 2013-10-31 LAB — IRON AND TIBC
%SAT: 7 % — ABNORMAL LOW (ref 20–55)
Iron: 23 ug/dL — ABNORMAL LOW (ref 42–165)
TIBC: 321 ug/dL (ref 215–435)
UIBC: 298 ug/dL (ref 125–400)

## 2013-10-31 LAB — FERRITIN: Ferritin: 25.5 ng/mL (ref 22.0–322.0)

## 2013-10-31 LAB — IBC PANEL
Iron: 24 ug/dL — ABNORMAL LOW (ref 42–165)
Saturation Ratios: 6.5 % — ABNORMAL LOW (ref 20.0–50.0)
Transferrin: 262.2 mg/dL (ref 212.0–360.0)

## 2013-11-01 ENCOUNTER — Encounter: Payer: Self-pay | Admitting: Internal Medicine

## 2013-11-01 ENCOUNTER — Ambulatory Visit (AMBULATORY_SURGERY_CENTER): Payer: Medicare Other | Admitting: Internal Medicine

## 2013-11-01 ENCOUNTER — Other Ambulatory Visit: Payer: Self-pay

## 2013-11-01 VITALS — BP 117/71 | HR 68 | Temp 96.5°F | Resp 15 | Ht 69.0 in | Wt 170.0 lb

## 2013-11-01 DIAGNOSIS — K2980 Duodenitis without bleeding: Secondary | ICD-10-CM

## 2013-11-01 DIAGNOSIS — K900 Celiac disease: Secondary | ICD-10-CM

## 2013-11-01 DIAGNOSIS — D500 Iron deficiency anemia secondary to blood loss (chronic): Secondary | ICD-10-CM

## 2013-11-01 DIAGNOSIS — D509 Iron deficiency anemia, unspecified: Secondary | ICD-10-CM

## 2013-11-01 DIAGNOSIS — R195 Other fecal abnormalities: Secondary | ICD-10-CM

## 2013-11-01 DIAGNOSIS — K2990 Gastroduodenitis, unspecified, without bleeding: Secondary | ICD-10-CM

## 2013-11-01 DIAGNOSIS — K2970 Gastritis, unspecified, without bleeding: Secondary | ICD-10-CM

## 2013-11-01 MED ORDER — LINACLOTIDE 145 MCG PO CAPS: 145.0000 ug | ORAL_CAPSULE | Freq: Every day | ORAL | Status: DC

## 2013-11-01 MED ORDER — SODIUM CHLORIDE 0.9 % IV SOLN: 500.0000 mL | INTRAVENOUS | Status: DC

## 2013-11-01 NOTE — Progress Notes (Signed)
Patient's wife keeps telling him to call his urologist.  The patient keeps telling her that he has an appointment on November 4th with him.

## 2013-11-01 NOTE — Progress Notes (Signed)
Procedure ends, to recovery, report given and VSS. 

## 2013-11-01 NOTE — Op Note (Signed)
[place]  [address] [place], [address]   ENDOSCOPY PROCEDURE REPORT  PATIENT: George Jacobson, George Jacobson  MR#: [id number] BIRTHDATE: Jan 06, 1951 , 32  yrs. old GENDER: Male ENDOSCOPIST: Jerene Bears, MD PROCEDURE DATE:  11/01/2013 PROCEDURE:  EGD w/ biopsy ASA CLASS:     Class III INDICATIONS:  iron deficiency anemia, recent heme positive stool, history of celiac disease/villous blunting seen on previous upper endoscopy. MEDICATIONS: MAC sedation, administered by CRNA and propofol (Diprivan) 150mg  IV TOPICAL ANESTHETIC: none  DESCRIPTION OF PROCEDURE: After the risks benefits and alternatives of the procedure were thoroughly explained, informed consent was obtained.  The LB [id number] endoscope was introduced through the mouth and advanced to the second portion of the duodenum. Without limitations.  The instrument was slowly withdrawn as the mucosa was fully examined.      The upper, middle and distal third of the esophagus were carefully inspected and no abnormalities were noted.  The z-line was well seen at the GEJ.  The endoscope was pushed into the fundus which was normal including a retroflexed view.  The antrum, gastric body, first and second part of the duodenum were unremarkable.  Biopsies were taken from the gastric body, antrum and incisura to exclude H. pylori. Biopsies also obtained from the duodenal bulb and second portion of the duodenum to exclude ongoing villous blunting and inflammation seen previously and felt to be related to celiac disease.  Retroflexed views revealed no abnormalities.     The scope was then withdrawn from the patient and the procedure completed.  COMPLICATIONS: There were no complications.  ENDOSCOPIC IMPRESSION: Normal EGD; biopsies were taken from the gastric body, antrum and incisura; biopsies taken from the duodenum  RECOMMENDATIONS: 1.  Await pathology results 2.  Continue iron replacement, repeat CBC in  one week 3.  Arrange video capsule endoscopy study  eSigned:  Jerene Bears, MD 11/01/2013 11:10 AM   CC:The Patient; [name], MD

## 2013-11-01 NOTE — Progress Notes (Signed)
Called to room to assist during endoscopic procedure.  Patient ID and intended procedure confirmed with present staff. Received instructions for my participation in the procedure from the performing physician.  

## 2013-11-01 NOTE — Patient Instructions (Signed)

## 2013-11-04 ENCOUNTER — Other Ambulatory Visit: Payer: Self-pay

## 2013-11-04 ENCOUNTER — Telehealth: Payer: Self-pay | Admitting: *Deleted

## 2013-11-04 DIAGNOSIS — K921 Melena: Secondary | ICD-10-CM

## 2013-11-04 NOTE — Telephone Encounter (Signed)
  Follow up Call-  Call back number 11/01/2013  Post procedure Call Back phone  # [phone]  Permission to leave phone message Yes     Patient questions:  Do you have a fever, pain , or abdominal swelling? No. Pain Score  0 *  Have you tolerated food without any problems? Yes.    Have you been able to return to your normal activities? Yes.    Do you have any questions about your discharge instructions: Diet   No. Medications  No. Follow up visit  No.  Do you have questions or concerns about your Care? No.  Actions: * If pain score is 4 or above: No action needed, pain <4.

## 2013-11-05 ENCOUNTER — Other Ambulatory Visit: Payer: Self-pay

## 2013-11-05 ENCOUNTER — Ambulatory Visit (INDEPENDENT_AMBULATORY_CARE_PROVIDER_SITE_OTHER): Payer: Medicare Other | Admitting: Internal Medicine

## 2013-11-05 ENCOUNTER — Other Ambulatory Visit (INDEPENDENT_AMBULATORY_CARE_PROVIDER_SITE_OTHER): Payer: Medicare Other

## 2013-11-05 DIAGNOSIS — D509 Iron deficiency anemia, unspecified: Secondary | ICD-10-CM

## 2013-11-05 DIAGNOSIS — D500 Iron deficiency anemia secondary to blood loss (chronic): Secondary | ICD-10-CM

## 2013-11-05 LAB — CBC WITH DIFFERENTIAL/PLATELET
Basophils Absolute: 0 10*3/uL (ref 0.0–0.1)
Basophils Relative: 0.4 % (ref 0.0–3.0)
Eosinophils Absolute: 0.1 10*3/uL (ref 0.0–0.7)
Eosinophils Relative: 2.5 % (ref 0.0–5.0)
HCT: 25.2 % — ABNORMAL LOW (ref 39.0–52.0)
Hemoglobin: 8.5 g/dL — ABNORMAL LOW (ref 13.0–17.0)
Lymphocytes Relative: 19.4 % (ref 12.0–46.0)
Lymphs Abs: 1.1 10*3/uL (ref 0.7–4.0)
MCHC: 33.8 g/dL (ref 30.0–36.0)
MCV: 92.9 fl (ref 78.0–100.0)
Monocytes Absolute: 0.4 10*3/uL (ref 0.1–1.0)
Monocytes Relative: 7.5 % (ref 3.0–12.0)
Neutro Abs: 3.9 10*3/uL (ref 1.4–7.7)
Neutrophils Relative %: 70.2 % (ref 43.0–77.0)
Platelets: 280 10*3/uL (ref 150.0–400.0)
RBC: 2.72 Mil/uL — ABNORMAL LOW (ref 4.22–5.81)
RDW: 15.3 % (ref 11.5–15.5)
WBC: 5.5 10*3/uL (ref 4.0–10.5)

## 2013-11-05 NOTE — Progress Notes (Signed)
Patient here for capsule endoscopy. Tolerated procedure. Verbalizes understanding of written and verbal instructions. DYN#[id number]  Exp:  2015                                10

## 2013-11-08 ENCOUNTER — Encounter: Payer: Self-pay | Admitting: Internal Medicine

## 2013-11-08 ENCOUNTER — Other Ambulatory Visit (INDEPENDENT_AMBULATORY_CARE_PROVIDER_SITE_OTHER): Payer: Medicare Other

## 2013-11-08 DIAGNOSIS — D509 Iron deficiency anemia, unspecified: Secondary | ICD-10-CM

## 2013-11-08 LAB — CBC WITH DIFFERENTIAL/PLATELET
Basophils Absolute: 0 10*3/uL (ref 0.0–0.1)
Basophils Relative: 0.5 % (ref 0.0–3.0)
Eosinophils Absolute: 0.1 10*3/uL (ref 0.0–0.7)
Eosinophils Relative: 1 % (ref 0.0–5.0)
HCT: 26.1 % — ABNORMAL LOW (ref 39.0–52.0)
Hemoglobin: 8.9 g/dL — ABNORMAL LOW (ref 13.0–17.0)
Lymphocytes Relative: 20.7 % (ref 12.0–46.0)
Lymphs Abs: 1 10*3/uL (ref 0.7–4.0)
MCHC: 34 g/dL (ref 30.0–36.0)
MCV: 90.5 fl (ref 78.0–100.0)
Monocytes Absolute: 0.5 10*3/uL (ref 0.1–1.0)
Monocytes Relative: 9.6 % (ref 3.0–12.0)
Neutro Abs: 3.4 10*3/uL (ref 1.4–7.7)
Neutrophils Relative %: 68.2 % (ref 43.0–77.0)
Platelets: 254 10*3/uL (ref 150.0–400.0)
RBC: 2.88 Mil/uL — ABNORMAL LOW (ref 4.22–5.81)
RDW: 14.6 % (ref 11.5–15.5)
WBC: 5 10*3/uL (ref 4.0–10.5)

## 2013-11-11 ENCOUNTER — Telehealth: Payer: Self-pay | Admitting: Internal Medicine

## 2013-11-11 ENCOUNTER — Other Ambulatory Visit: Payer: Self-pay

## 2013-11-11 DIAGNOSIS — K900 Celiac disease: Secondary | ICD-10-CM

## 2013-11-11 NOTE — Telephone Encounter (Signed)
See result note.  

## 2013-11-18 ENCOUNTER — Telehealth: Payer: Self-pay

## 2013-11-18 NOTE — Telephone Encounter (Signed)
Pt will come Friday for labs.

## 2013-11-18 NOTE — Telephone Encounter (Signed)
Message copied by Algernon Huxley on Mon Nov 18, 2013  1:46 PM ------      Message from: HUNT, Virginia R      Created: Tue Nov 05, 2013  1:12 PM      Regarding: CBC       Needs CBC, order in epic. ------

## 2013-11-21 ENCOUNTER — Encounter: Payer: Self-pay | Admitting: Internal Medicine

## 2013-11-22 ENCOUNTER — Other Ambulatory Visit (INDEPENDENT_AMBULATORY_CARE_PROVIDER_SITE_OTHER): Payer: Medicare Other

## 2013-11-22 DIAGNOSIS — K900 Celiac disease: Secondary | ICD-10-CM

## 2013-11-22 LAB — CBC WITH DIFFERENTIAL/PLATELET
Basophils Absolute: 0 10*3/uL (ref 0.0–0.1)
Basophils Relative: 0.8 % (ref 0.0–3.0)
Eosinophils Absolute: 0.1 10*3/uL (ref 0.0–0.7)
Eosinophils Relative: 3.2 % (ref 0.0–5.0)
HCT: 29.1 % — ABNORMAL LOW (ref 39.0–52.0)
Hemoglobin: 9.9 g/dL — ABNORMAL LOW (ref 13.0–17.0)
Lymphocytes Relative: 33.1 % (ref 12.0–46.0)
Lymphs Abs: 1.1 10*3/uL (ref 0.7–4.0)
MCHC: 34.1 g/dL (ref 30.0–36.0)
MCV: 89.6 fl (ref 78.0–100.0)
Monocytes Absolute: 0.4 10*3/uL (ref 0.1–1.0)
Monocytes Relative: 10.8 % (ref 3.0–12.0)
Neutro Abs: 1.7 10*3/uL (ref 1.4–7.7)
Neutrophils Relative %: 52.1 % (ref 43.0–77.0)
Platelets: 169 10*3/uL (ref 150.0–400.0)
RBC: 3.25 Mil/uL — ABNORMAL LOW (ref 4.22–5.81)
RDW: 14.4 % (ref 11.5–15.5)
WBC: 3.3 10*3/uL — ABNORMAL LOW (ref 4.0–10.5)

## 2013-11-25 ENCOUNTER — Other Ambulatory Visit: Payer: Self-pay

## 2013-11-25 DIAGNOSIS — D509 Iron deficiency anemia, unspecified: Secondary | ICD-10-CM

## 2013-11-27 LAB — CELIAC DISEASE HLA DQ ASSOC.
DQ2 (DQA1 0501/0505,DQB1 02XX): NEGATIVE
DQ8 (DQA1 03XX, DQB1 0302): NEGATIVE

## 2013-12-10 ENCOUNTER — Encounter: Payer: Self-pay | Admitting: Internal Medicine

## 2013-12-10 ENCOUNTER — Ambulatory Visit (INDEPENDENT_AMBULATORY_CARE_PROVIDER_SITE_OTHER): Payer: Medicare Other | Admitting: Internal Medicine

## 2013-12-10 VITALS — BP 136/64 | HR 76 | Ht 69.0 in | Wt 169.2 lb

## 2013-12-10 DIAGNOSIS — K900 Celiac disease: Secondary | ICD-10-CM

## 2013-12-10 DIAGNOSIS — K5900 Constipation, unspecified: Secondary | ICD-10-CM

## 2013-12-10 DIAGNOSIS — D509 Iron deficiency anemia, unspecified: Secondary | ICD-10-CM

## 2013-12-10 DIAGNOSIS — K5521 Angiodysplasia of colon with hemorrhage: Secondary | ICD-10-CM

## 2013-12-10 MED ORDER — LINACLOTIDE 290 MCG PO CAPS: 290.0000 ug | ORAL_CAPSULE | Freq: Every day | ORAL | Status: DC

## 2013-12-10 NOTE — Patient Instructions (Signed)
Continue oral iron daily. Continue pantoprazole. Increase your Linzess to 290 mcg. A new prescription will be sent. Add back gluten to your diet. Have labs in 1 month. Follow up in 4 months with Dr Hilarie Fredrickson. CC:  [name] MD

## 2013-12-10 NOTE — Progress Notes (Signed)
Subjective:    Patient ID: George Jacobson, male    DOB: 11/14/1950, 63 y.o.   MRN: [id number]  HPI Mr. Colson Barco is a 63 year old male with a past medical history of iron deficiency anemia, questionable celiac disease, urothelial cancer status post nephrectomy, hypertension who is seen for follow-up. Unfortunately he became severely anemic requiring hospitalization in August 2015. This required additional blood transfusion. Prior to this he had been off of oral iron therapy and also off of PPI. He has been following strict gluten-free diet since September 2014 when upper endoscopy revealed small bowel biopsy changes with mild villous blunting. At that time he also had a video capsule endoscopy with a small angiodysplasia.  Since leaving the hospital  Upper endoscopy and capsule endoscopy were repeated. Upper endoscopy revealed normal-appearing small bowel which was biopsied. No evidence of gastritis or gastric antral vascular ectasia. I had pathology results reviewed with Dr. Donato Heinz. He reports there was some evidence of inflammation in the small bowel but when compared to previous biopsies it was improved. There was no evidence of villous blunting and no definite evidence for celiac disease. HLA testing was then performed and he was negative for DQ2 and DQ8. The capsule endoscopy was incomplete. It showed an ulcer which was felt to be a small bowel biopsy site and a mid bowel angiodysplasia not bleeding  He has continued a strict gluten-free diet. He is taking oral iron supplementation daily. He is back on pantoprazole 40 mg daily. He tried Linzess 145 mcg and had no improvement constipation so he started MiraLAX back. No blood in his stool or melena. No abdominal pain. Energy levels are improving. He is excited that he is going to [place] next month with his grandchildren.  Recent labs performed within the last 4 days by nephrology. Hemoglobin increased to 11.1 iron studies also increased  percent sat 16  Review of Systems As per history of present illness, otherwise negative    Objective:   Physical Exam BP 136/64  Pulse 76  Ht '5\' 9"'  (1.753 m)  Wt 169 lb 4 oz (76.771 kg)  BMI 24.98 kg/m2 Constitutional: Well-developed and well-nourished. No distress. HEENT: Normocephalic and atraumatic.No scleral icterus. Neck: Neck supple. Trachea midline. Cardiovascular: Normal rate, regular rhythm and intact distal pulses.  Pulmonary/chest: Effort normal and breath sounds normal. No wheezing, rales or rhonchi. Abdominal: Soft, nontender, nondistended. Bowel sounds active throughout.  Extremities: no clubbing, cyanosis, or edema Neurological: Alert and oriented to person place and time. Psychiatric: Normal mood and affect. Behavior is normal.  CBC    Component Value Date/Time   WBC 3.3* 11/22/2013 1542   RBC 3.25* 11/22/2013 1542   RBC 1.96* 03/21/2011 1830   HGB 9.9* 11/22/2013 1542   HCT 29.1* 11/22/2013 1542   PLT 169.0 11/22/2013 1542   MCV 89.6 11/22/2013 1542   MCH 30.8 10/09/2013 0515   MCHC 34.1 11/22/2013 1542   RDW 14.4 11/22/2013 1542   LYMPHSABS 1.1 11/22/2013 1542   MONOABS 0.4 11/22/2013 1542   EOSABS 0.1 11/22/2013 1542   BASOSABS 0.0 11/22/2013 1542    CMP     Component Value Date/Time   NA 138 10/08/2013 0625   K 4.3 10/08/2013 0625   CL 103 10/08/2013 0625   CO2 27 10/08/2013 0625   GLUCOSE 95 10/08/2013 0625   BUN 25* 10/08/2013 0625   CREATININE 1.49* 10/08/2013 0625   CALCIUM 8.2* 10/08/2013 0625   PROT 6.1 10/28/2012 0510   ALBUMIN 3.4* 10/28/2012  0510   AST 12 10/28/2012 0510   ALT 8 10/28/2012 0510   ALKPHOS 39 10/28/2012 0510   BILITOT 0.5 10/28/2012 0510   GFRNONAA 48* 10/08/2013 0625   GFRAA 55* 10/08/2013 0625    Iron/TIBC/Ferritin/ %Sat    Component Value Date/Time   IRON 24* 10/31/2013 1516   IRON 23* 10/31/2013 1516   TIBC 321 10/31/2013 1516   FERRITIN 25.5 10/31/2013 1516   IRONPCTSAT 7* 10/31/2013 1516   IRONPCTSAT 6.5* 10/31/2013 1516         Assessment & Plan:   63 year old male with a past medical history of iron deficiency anemia, questionable celiac disease, urothelial cancer status post nephrectomy, hypertension who is seen for follow-up.   1. IDA/? Celiac/small bowel angiodysplastic lesions -- he has had several discrete episodes of severe anemia requiring transfusion, always iron deficiency. On these occasions only once has his stool being heme positive. I do think he very likely has small bowel angiodysplasias which ooze blood intermittently and result in iron deficiency and subsequent anemia. The major question is that of celiac disease. Initially in the setting of iron deficiency with heme-negative stool and biopsy suggestive of villous blunting, celiac disease was felt very very likely. Interestingly HLA and TTG testing have been negative. We had a long discussion today regarding whether or not gluten-free diet will help in any way regarding iron deficiency anemia. With the subsequent testing, celiac disease is felt now less likely. Only time will tell with reinstitution of gluten will lead to recurrent iron deficiency. This is also complicated by probable angiodysplasias. I do think we will be able to "keep up" with his iron levels but continuous oral iron supplementation. We will continue iron study and CBC monitoring. Hemoglobin is improving, recently 11.1 which is very reassuring. He will continue oral daily iron and pantoprazole. I am okay with moderate reinstitution of gluten into his diet at this time. CBC and iron studies in 1 month.  2. Constipation -- inadequate response to Linzess 145 mcg daily.  Increase to 290 mcg daily.  If no response will likely switch to lubiprostone  Repeat visit in 4 months

## 2014-01-16 ENCOUNTER — Other Ambulatory Visit (INDEPENDENT_AMBULATORY_CARE_PROVIDER_SITE_OTHER): Payer: Medicare Other

## 2014-01-16 DIAGNOSIS — K900 Celiac disease: Secondary | ICD-10-CM | POA: Diagnosis not present

## 2014-01-16 LAB — CBC WITH DIFFERENTIAL/PLATELET
Basophils Absolute: 0 10*3/uL (ref 0.0–0.1)
Basophils Relative: 0.5 % (ref 0.0–3.0)
Eosinophils Absolute: 0.1 10*3/uL (ref 0.0–0.7)
Eosinophils Relative: 1.2 % (ref 0.0–5.0)
HCT: 39.2 % (ref 39.0–52.0)
Hemoglobin: 13.2 g/dL (ref 13.0–17.0)
Lymphocytes Relative: 29 % (ref 12.0–46.0)
Lymphs Abs: 1.4 10*3/uL (ref 0.7–4.0)
MCHC: 33.6 g/dL (ref 30.0–36.0)
MCV: 87.2 fl (ref 78.0–100.0)
Monocytes Absolute: 0.5 10*3/uL (ref 0.1–1.0)
Monocytes Relative: 11 % (ref 3.0–12.0)
Neutro Abs: 2.9 10*3/uL (ref 1.4–7.7)
Neutrophils Relative %: 58.3 % (ref 43.0–77.0)
Platelets: 267 10*3/uL (ref 150.0–400.0)
RBC: 4.49 Mil/uL (ref 4.22–5.81)
RDW: 13.7 % (ref 11.5–15.5)
WBC: 4.9 10*3/uL (ref 4.0–10.5)

## 2014-01-16 LAB — FERRITIN: Ferritin: 31.5 ng/mL (ref 22.0–322.0)

## 2014-01-16 LAB — VITAMIN B12: Vitamin B-12: 435 pg/mL (ref 211–911)

## 2014-01-18 LAB — IBC PANEL
Iron: 56 ug/dL (ref 42–165)
Saturation Ratios: 18.4 % — ABNORMAL LOW (ref 20.0–50.0)
Transferrin: 217.2 mg/dL (ref 212.0–360.0)

## 2014-03-04 ENCOUNTER — Encounter: Payer: Self-pay | Admitting: *Deleted

## 2014-03-10 ENCOUNTER — Encounter: Payer: Self-pay | Admitting: Internal Medicine

## 2014-03-10 ENCOUNTER — Ambulatory Visit (INDEPENDENT_AMBULATORY_CARE_PROVIDER_SITE_OTHER): Payer: Medicare Other | Admitting: Internal Medicine

## 2014-03-10 VITALS — BP 124/62 | HR 76 | Ht 69.0 in | Wt 170.1 lb

## 2014-03-10 DIAGNOSIS — D509 Iron deficiency anemia, unspecified: Secondary | ICD-10-CM

## 2014-03-10 DIAGNOSIS — Z86010 Personal history of colonic polyps: Secondary | ICD-10-CM

## 2014-03-10 DIAGNOSIS — K219 Gastro-esophageal reflux disease without esophagitis: Secondary | ICD-10-CM

## 2014-03-10 DIAGNOSIS — K5520 Angiodysplasia of colon without hemorrhage: Secondary | ICD-10-CM

## 2014-03-10 MED ORDER — LUBIPROSTONE 24 MCG PO CAPS: 24.0000 ug | ORAL_CAPSULE | Freq: Two times a day (BID) | ORAL | Status: DC

## 2014-03-10 NOTE — Patient Instructions (Signed)
We have given you samples of Amitiza 24 mcg to take 2 times daily. If this works well for you, please call our office at [phone].  Please continue oral iron daily.  Please come for labs in March as previously planned.  CC:Dr Merris [employer]

## 2014-03-10 NOTE — Progress Notes (Signed)
Subjective:    Patient ID: George Jacobson, male    DOB: 03-15-1950, 64 y.o.   MRN: [id number]  HPI George Jacobson is a 64 year old male with a past medical history of iron deficiency anemia, small intestinal angiodysplasia, urothelial cancer status post nephrectomy, and hypertension who seen for follow-up. He was last seen in October 2015. He reports he is felt very well lately and blood counts recently has shown improving hemoglobin and iron stores. He is taking iron once daily. He denies fatigue. No bleeding including melena or rectal bleeding. He traveled to [place] with family in November and had a wonderful trip. He is eating well without nausea or vomiting. No trouble swallowing. No significant heartburn though he is taking pantoprazole 40 mg daily. He has liberalized his diet and is no longer strictly avoiding gluten. He's had no negative symptoms associated with this dietary change. Celiac disease was at one point entertained though he had negative DQ 2 and DQ 8 testing. He is continuing to have issues with constipation and had no response to Linzess at either 145 g or 290 g daily.  Review of Systems As per history of present illness, otherwise negative  Current Medications, Allergies, Past Medical History, Past Surgical History, Family History and Social History were reviewed in [employer] record.     Objective:   Physical Exam BP 124/62 mmHg  Pulse 76  Ht 5\' 9"  (1.753 m)  Wt 170 lb 2 oz (77.168 kg)  BMI 25.11 kg/m2 Constitutional: Well-developed and well-nourished. No distress. HEENT: Normocephalic and atraumatic. Oropharynx is clear and moist. No oropharyngeal exudate. Conjunctivae are normal.  No scleral icterus. Neck: Neck supple. Trachea midline. Cardiovascular: Normal rate, regular rhythm and intact distal pulses. No M/R/G Pulmonary/chest: Effort normal and breath sounds normal. No wheezing, rales or rhonchi. Abdominal: Soft, nontender, nondistended.  Bowel sounds active throughout. There are no masses palpable. No hepatosplenomegaly. Extremities: no clubbing, cyanosis, or edema Neurological: Alert and oriented to person place and time. Skin: Skin is warm and dry. No rashes noted. Psychiatric: Normal mood and affect. Behavior is normal.  CBC    Component Value Date/Time   WBC 4.9 01/16/2014 1545   RBC 4.49 01/16/2014 1545   RBC 1.96* 03/21/2011 1830   HGB 13.2 01/16/2014 1545   HCT 39.2 01/16/2014 1545   PLT 267.0 01/16/2014 1545   MCV 87.2 01/16/2014 1545   MCH 30.8 10/09/2013 0515   MCHC 33.6 01/16/2014 1545   RDW 13.7 01/16/2014 1545   LYMPHSABS 1.4 01/16/2014 1545   MONOABS 0.5 01/16/2014 1545   EOSABS 0.1 01/16/2014 1545   BASOSABS 0.0 01/16/2014 1545    CMP     Component Value Date/Time   NA 138 10/08/2013 0625   K 4.3 10/08/2013 0625   CL 103 10/08/2013 0625   CO2 27 10/08/2013 0625   GLUCOSE 95 10/08/2013 0625   BUN 25* 10/08/2013 0625   CREATININE 1.49* 10/08/2013 0625   CALCIUM 8.2* 10/08/2013 0625   PROT 6.1 10/28/2012 0510   ALBUMIN 3.4* 10/28/2012 0510   AST 12 10/28/2012 0510   ALT 8 10/28/2012 0510   ALKPHOS 39 10/28/2012 0510   BILITOT 0.5 10/28/2012 0510   GFRNONAA 48* 10/08/2013 0625   GFRAA 55* 10/08/2013 0625    Iron/TIBC/Ferritin/ %Sat    Component Value Date/Time   IRON 56 01/16/2014 1545   TIBC 321 10/31/2013 1516   FERRITIN 31.5 01/16/2014 1545   IRONPCTSAT 18.4* 01/16/2014 1545   IRONPCTSAT 7* 10/31/2013  64         Assessment & Plan:  64 year old male with a past medical history of iron deficiency anemia, small intestinal angiodysplasia, urothelial cancer status post nephrectomy, and hypertension who seen for follow-up.  1. IDA -- likely the result of small bowel angiodysplastic lesions. Anemia resolved on oral iron. Much improvement in iron stores. I do not think he has celiac disease despite suggestion of previous biopsy. Continue iron once daily. We'll continue  pantoprazole 40 mg daily. Recheck iron stores and blood counts in March as previously ordered.  2. Constipation -- chronic, no response to Linzess. Trial of lubiprostone 24 g twice a day.  3. Hx of colon polyp -- small adenoma September 2014, repeat September 2019  6-12 month followup, monitor labs as above

## 2014-03-13 ENCOUNTER — Other Ambulatory Visit: Payer: Self-pay | Admitting: Internal Medicine

## 2014-03-26 ENCOUNTER — Other Ambulatory Visit: Payer: Self-pay | Admitting: Internal Medicine

## 2014-03-29 ENCOUNTER — Other Ambulatory Visit: Payer: Self-pay | Admitting: Internal Medicine

## 2014-04-09 ENCOUNTER — Telehealth: Payer: Self-pay | Admitting: Internal Medicine

## 2014-04-09 ENCOUNTER — Other Ambulatory Visit: Payer: Self-pay | Admitting: Internal Medicine

## 2014-04-09 MED ORDER — PANTOPRAZOLE SODIUM 40 MG PO TBEC: 40.0000 mg | DELAYED_RELEASE_TABLET | Freq: Every day | ORAL | Status: DC

## 2014-04-09 NOTE — Telephone Encounter (Signed)
Patient states that he has been out of medication x 3 weeks but found out that his pharmacy has been sending the request to the wrong fax number. I will send refills of pantoprazole to the pharmacy for him. He verbalizes understanding.

## 2014-04-21 ENCOUNTER — Telehealth: Payer: Self-pay

## 2014-04-21 NOTE — Telephone Encounter (Signed)
-----   Message from Maury Dus, RN sent at 01/21/2014 10:39 AM EST ----- Regarding: Labs Needs labs in 3 mth, orders in epic.

## 2014-04-21 NOTE — Telephone Encounter (Signed)
Pt states he cannot come until the end of March.

## 2014-05-13 ENCOUNTER — Other Ambulatory Visit (INDEPENDENT_AMBULATORY_CARE_PROVIDER_SITE_OTHER): Payer: Medicare Other

## 2014-05-13 DIAGNOSIS — K900 Celiac disease: Secondary | ICD-10-CM | POA: Diagnosis not present

## 2014-05-13 LAB — CBC WITH DIFFERENTIAL/PLATELET
Basophils Absolute: 0 10*3/uL (ref 0.0–0.1)
Basophils Relative: 0.4 % (ref 0.0–3.0)
Eosinophils Absolute: 0.1 10*3/uL (ref 0.0–0.7)
Eosinophils Relative: 1.4 % (ref 0.0–5.0)
HCT: 36.8 % — ABNORMAL LOW (ref 39.0–52.0)
Hemoglobin: 12.9 g/dL — ABNORMAL LOW (ref 13.0–17.0)
Lymphocytes Relative: 28.7 % (ref 12.0–46.0)
Lymphs Abs: 1.3 10*3/uL (ref 0.7–4.0)
MCHC: 35.2 g/dL (ref 30.0–36.0)
MCV: 89.4 fl (ref 78.0–100.0)
Monocytes Absolute: 0.4 10*3/uL (ref 0.1–1.0)
Monocytes Relative: 9.6 % (ref 3.0–12.0)
Neutro Abs: 2.7 10*3/uL (ref 1.4–7.7)
Neutrophils Relative %: 59.9 % (ref 43.0–77.0)
Platelets: 235 10*3/uL (ref 150.0–400.0)
RBC: 4.11 Mil/uL — ABNORMAL LOW (ref 4.22–5.81)
RDW: 12.7 % (ref 11.5–15.5)
WBC: 4.5 10*3/uL (ref 4.0–10.5)

## 2014-05-19 ENCOUNTER — Other Ambulatory Visit: Payer: Self-pay

## 2014-05-19 DIAGNOSIS — D62 Acute posthemorrhagic anemia: Secondary | ICD-10-CM

## 2014-08-17 ENCOUNTER — Other Ambulatory Visit: Payer: Self-pay | Admitting: Internal Medicine

## 2014-08-19 ENCOUNTER — Telehealth: Payer: Self-pay

## 2014-08-19 NOTE — Telephone Encounter (Signed)
-----   Message from Algernon Huxley, RN sent at 05/19/2014 11:16 AM EDT ----- Regarding: Labs Pt needs labs in July, orders in epic.

## 2014-08-19 NOTE — Telephone Encounter (Signed)
Pt aware.

## 2014-08-27 ENCOUNTER — Other Ambulatory Visit (INDEPENDENT_AMBULATORY_CARE_PROVIDER_SITE_OTHER): Payer: Medicare Other

## 2014-08-27 ENCOUNTER — Telehealth: Payer: Self-pay | Admitting: Internal Medicine

## 2014-08-27 DIAGNOSIS — D62 Acute posthemorrhagic anemia: Secondary | ICD-10-CM | POA: Diagnosis not present

## 2014-08-27 LAB — CBC WITH DIFFERENTIAL/PLATELET
Basophils Absolute: 0 10*3/uL (ref 0.0–0.1)
Basophils Relative: 0.5 % (ref 0.0–3.0)
Eosinophils Absolute: 0 10*3/uL (ref 0.0–0.7)
Eosinophils Relative: 1 % (ref 0.0–5.0)
HCT: 27.7 % — ABNORMAL LOW (ref 39.0–52.0)
Hemoglobin: 9.2 g/dL — ABNORMAL LOW (ref 13.0–17.0)
Lymphocytes Relative: 27.7 % (ref 12.0–46.0)
Lymphs Abs: 1.1 10*3/uL (ref 0.7–4.0)
MCHC: 33.3 g/dL (ref 30.0–36.0)
MCV: 81.7 fl (ref 78.0–100.0)
Monocytes Absolute: 0.5 10*3/uL (ref 0.1–1.0)
Monocytes Relative: 11.1 % (ref 3.0–12.0)
Neutro Abs: 2.4 10*3/uL (ref 1.4–7.7)
Neutrophils Relative %: 59.7 % (ref 43.0–77.0)
Platelets: 260 10*3/uL (ref 150.0–400.0)
RBC: 3.39 Mil/uL — ABNORMAL LOW (ref 4.22–5.81)
RDW: 13.3 % (ref 11.5–15.5)
WBC: 4.1 10*3/uL (ref 4.0–10.5)

## 2014-08-27 LAB — IBC PANEL
Iron: 17 ug/dL — ABNORMAL LOW (ref 42–165)
Saturation Ratios: 4.1 % — ABNORMAL LOW (ref 20.0–50.0)
Transferrin: 296 mg/dL (ref 212.0–360.0)

## 2014-08-27 LAB — FERRITIN: Ferritin: 4.4 ng/mL — ABNORMAL LOW (ref 22.0–322.0)

## 2014-08-27 NOTE — Telephone Encounter (Signed)
He needs to continue iron because his iron is very low and this is leading to anemia I do not think that iron supplementation will "harbor bacteria"

## 2014-08-27 NOTE — Telephone Encounter (Signed)
Pt states his nephrologist told him his iron level is to high and that he needs to stop his iron supplement. Pt states the doctor said to much iron could harbor bacteria??? Discussed with pt that he had his ferritin level checked today and we can go from there. Pt verbalized understanding. Dr. Hilarie Fredrickson aware.

## 2014-08-27 NOTE — Telephone Encounter (Signed)
Dr. Hilarie Fredrickson please advise regarding labs from today.

## 2014-08-28 ENCOUNTER — Other Ambulatory Visit: Payer: Self-pay

## 2014-08-28 DIAGNOSIS — D509 Iron deficiency anemia, unspecified: Secondary | ICD-10-CM

## 2014-08-28 NOTE — Telephone Encounter (Signed)
Spoke with pt and he is aware. Orders in epic. 

## 2014-11-21 ENCOUNTER — Other Ambulatory Visit (INDEPENDENT_AMBULATORY_CARE_PROVIDER_SITE_OTHER): Payer: Medicare Other

## 2014-11-21 DIAGNOSIS — D509 Iron deficiency anemia, unspecified: Secondary | ICD-10-CM | POA: Diagnosis not present

## 2014-11-21 LAB — IBC PANEL
Iron: 92 ug/dL (ref 42–165)
Saturation Ratios: 29.2 % (ref 20.0–50.0)
Transferrin: 225 mg/dL (ref 212.0–360.0)

## 2014-11-21 LAB — CBC WITH DIFFERENTIAL/PLATELET
Basophils Absolute: 0 10*3/uL (ref 0.0–0.1)
Basophils Relative: 0.3 % (ref 0.0–3.0)
Eosinophils Absolute: 0.1 10*3/uL (ref 0.0–0.7)
Eosinophils Relative: 1.1 % (ref 0.0–5.0)
HCT: 40 % (ref 39.0–52.0)
Hemoglobin: 13.7 g/dL (ref 13.0–17.0)
Lymphocytes Relative: 22.1 % (ref 12.0–46.0)
Lymphs Abs: 1.2 10*3/uL (ref 0.7–4.0)
MCHC: 34.2 g/dL (ref 30.0–36.0)
MCV: 89 fl (ref 78.0–100.0)
Monocytes Absolute: 0.6 10*3/uL (ref 0.1–1.0)
Monocytes Relative: 11.9 % (ref 3.0–12.0)
Neutro Abs: 3.5 10*3/uL (ref 1.4–7.7)
Neutrophils Relative %: 64.6 % (ref 43.0–77.0)
Platelets: 204 10*3/uL (ref 150.0–400.0)
RBC: 4.49 Mil/uL (ref 4.22–5.81)
RDW: 15.4 % (ref 11.5–15.5)
WBC: 5.4 10*3/uL (ref 4.0–10.5)

## 2014-11-21 LAB — FERRITIN: Ferritin: 29.2 ng/mL (ref 22.0–322.0)

## 2014-11-24 ENCOUNTER — Other Ambulatory Visit: Payer: Self-pay

## 2014-11-24 DIAGNOSIS — D509 Iron deficiency anemia, unspecified: Secondary | ICD-10-CM

## 2014-12-19 ENCOUNTER — Other Ambulatory Visit: Payer: Self-pay | Admitting: Internal Medicine

## 2015-02-14 ENCOUNTER — Other Ambulatory Visit: Payer: Self-pay | Admitting: Internal Medicine

## 2015-02-24 ENCOUNTER — Other Ambulatory Visit: Payer: Self-pay | Admitting: Internal Medicine

## 2015-02-25 ENCOUNTER — Other Ambulatory Visit: Payer: Self-pay | Admitting: Internal Medicine

## 2015-04-17 ENCOUNTER — Ambulatory Visit: Payer: Medicare Other | Admitting: Internal Medicine

## 2015-04-22 ENCOUNTER — Other Ambulatory Visit: Payer: Self-pay | Admitting: Internal Medicine

## 2015-05-20 ENCOUNTER — Telehealth: Payer: Self-pay

## 2015-05-20 ENCOUNTER — Encounter: Payer: Self-pay | Admitting: *Deleted

## 2015-05-20 NOTE — Telephone Encounter (Signed)
-----   Message from Algernon Huxley, RN sent at 11/24/2014  2:56 PM EDT ----- Regarding: Labs Pt needs labs in April, orders in epic.

## 2015-05-20 NOTE — Telephone Encounter (Signed)
Pt aware and states he will have labs the day of his OV.

## 2015-06-01 ENCOUNTER — Other Ambulatory Visit (INDEPENDENT_AMBULATORY_CARE_PROVIDER_SITE_OTHER): Payer: Medicare Other

## 2015-06-01 ENCOUNTER — Encounter: Payer: Self-pay | Admitting: Internal Medicine

## 2015-06-01 ENCOUNTER — Ambulatory Visit (INDEPENDENT_AMBULATORY_CARE_PROVIDER_SITE_OTHER): Payer: Medicare Other | Admitting: Internal Medicine

## 2015-06-01 VITALS — BP 140/84 | HR 68 | Ht 68.5 in | Wt 183.4 lb

## 2015-06-01 DIAGNOSIS — D509 Iron deficiency anemia, unspecified: Secondary | ICD-10-CM

## 2015-06-01 DIAGNOSIS — K5909 Other constipation: Secondary | ICD-10-CM

## 2015-06-01 DIAGNOSIS — K219 Gastro-esophageal reflux disease without esophagitis: Secondary | ICD-10-CM | POA: Diagnosis not present

## 2015-06-01 LAB — CBC WITH DIFFERENTIAL/PLATELET
Basophils Absolute: 0 10*3/uL (ref 0.0–0.1)
Basophils Relative: 0.5 % (ref 0.0–3.0)
Eosinophils Absolute: 0.1 10*3/uL (ref 0.0–0.7)
Eosinophils Relative: 1.5 % (ref 0.0–5.0)
HCT: 35.4 % — ABNORMAL LOW (ref 39.0–52.0)
Hemoglobin: 12.3 g/dL — ABNORMAL LOW (ref 13.0–17.0)
Lymphocytes Relative: 28.5 % (ref 12.0–46.0)
Lymphs Abs: 1.4 10*3/uL (ref 0.7–4.0)
MCHC: 34.8 g/dL (ref 30.0–36.0)
MCV: 91.5 fl (ref 78.0–100.0)
Monocytes Absolute: 0.5 10*3/uL (ref 0.1–1.0)
Monocytes Relative: 9.8 % (ref 3.0–12.0)
Neutro Abs: 2.8 10*3/uL (ref 1.4–7.7)
Neutrophils Relative %: 59.7 % (ref 43.0–77.0)
Platelets: 187 10*3/uL (ref 150.0–400.0)
RBC: 3.87 Mil/uL — ABNORMAL LOW (ref 4.22–5.81)
RDW: 13.1 % (ref 11.5–15.5)
WBC: 4.8 10*3/uL (ref 4.0–10.5)

## 2015-06-01 LAB — IBC PANEL
Iron: 147 ug/dL (ref 42–165)
Saturation Ratios: 45.3 % (ref 20.0–50.0)
Transferrin: 232 mg/dL (ref 212.0–360.0)

## 2015-06-01 MED ORDER — POLYETHYLENE GLYCOL 3350 17 GM/SCOOP PO POWD: ORAL | Status: DC

## 2015-06-01 MED ORDER — PANTOPRAZOLE SODIUM 40 MG PO TBEC: 40.0000 mg | DELAYED_RELEASE_TABLET | Freq: Every day | ORAL | Status: DC

## 2015-06-01 NOTE — Patient Instructions (Signed)
We have sent the following medications to your pharmacy for you to pick up at your convenience: Miralax 17 grams twice daily Pantoprazole 40 mg daily  Decrease iron to once daily dosing.  Your physician has requested that you go to the basement for the following lab work before leaving today: CBC, IBC  If you are age 67 or older, your body mass index should be between 23-30. Your Body mass index is 27.47 kg/(m^2). If this is out of the aforementioned range listed, please consider follow up with your Primary Care Provider.  If you are age 41 or younger, your body mass index should be between 19-25. Your Body mass index is 27.47 kg/(m^2). If this is out of the aformentioned range listed, please consider follow up with your Primary Care Provider.

## 2015-06-01 NOTE — Progress Notes (Signed)
   Subjective:    Patient ID: JAFFAR Jacobson, male    DOB: 1950-12-12, 65 y.o.   MRN: [id number]  HPI [name] is a 64 year old male with history of iron deficiency anemia, small bowel angiodysplasia, fibromyalgia, urothelial cancer status post nephrectomy and hypertension who is here for follow-up. He was last seen in January 2016. He reports that on the whole he has been feeling very well. He has recently been started on gabapentin which is helped tremendously with overall fibromyalgia and stress associated with this process. He sees rheumatology in [place], [place]. He has been using MiraLAX 17 g once or twice daily which is helped significantly with his chronic constipation. Linzess was not effective. We prescribed Amitiza after last visit but this was stopped, he assumes because it was not helpful. He has continued oral iron supplementation twice a day. He continues on pantoprazole 40 mg daily without problem. No heartburn, dysphagia or odynophagia. Bowel movements are regular with MiraLAX but dark while on oral iron. No visible blood or definite melena. Energy levels have been "great".   Review of Systems As per history of present illness, otherwise negative  Current Medications, Allergies, Past Medical History, Past Surgical History, Family History and Social History were reviewed in [employer] record.      Objective:   Physical Exam BP 140/84 mmHg  Pulse 68  Ht 5' 8.5" (1.74 m)  Wt 183 lb 6 oz (83.178 kg)  BMI 27.47 kg/m2 Constitutional: Well-developed and well-nourished. No distress. HEENT: Normocephalic and atraumatic. [name] Conjunctivae are normal.  No scleral icterus. Skin: Skin is warm and dry. No rashes noted. Psychiatric: Normal mood and affect. Behavior is normal.  CBC    Component Value Date/Time   WBC 5.4 11/21/2014 1548   RBC 4.49 11/21/2014 1548   RBC 1.96* 03/21/2011 1830   HGB 13.7 11/21/2014 1548   HCT 40.0 11/21/2014 1548   PLT 204.0  11/21/2014 1548   MCV 89.0 11/21/2014 1548   MCH 30.8 10/09/2013 0515   MCHC 34.2 11/21/2014 1548   RDW 15.4 11/21/2014 1548   LYMPHSABS 1.2 11/21/2014 1548   MONOABS 0.6 11/21/2014 1548   EOSABS 0.1 11/21/2014 1548   BASOSABS 0.0 11/21/2014 1548   Iron/TIBC/Ferritin/ %Sat    Component Value Date/Time   IRON 92 11/21/2014 1548   TIBC 321 10/31/2013 1516   FERRITIN 29.2 11/21/2014 1548   IRONPCTSAT 29.2 11/21/2014 1548   IRONPCTSAT 7* 10/31/2013 1516      Assessment & Plan:  65 year old male with history of iron deficiency anemia, small bowel angiodysplasia, fibromyalgia, urothelial cancer status post nephrectomy and hypertension who is here for follow-up.  1. IDA -- History of and likely due to small bowel angiodysplastic lesions. Very responsive to oral iron. He has developed recurrent iron deficiency anemia with every previous attempt to discontinue oral iron. Genetics negative for celiac. Repeat iron studies today and CBC. Decreased iron to 325 mg once daily. Continue pantoprazole 40 mg daily.  2. Constipation -- responsive to MiraLAX. Continue MiraLAX 17 g once to twice daily.  3. History of colon polyp -- small adenoma, due surveillance September 2019  One year follow-up 15 minutes spent with the patient today. Greater than 50% was spent in counseling and coordination of care with the patient

## 2015-06-02 ENCOUNTER — Other Ambulatory Visit: Payer: Self-pay

## 2015-06-02 DIAGNOSIS — D509 Iron deficiency anemia, unspecified: Secondary | ICD-10-CM

## 2015-11-10 ENCOUNTER — Ambulatory Visit (INDEPENDENT_AMBULATORY_CARE_PROVIDER_SITE_OTHER): Payer: Medicare Other | Admitting: Neurology

## 2015-11-10 ENCOUNTER — Encounter: Payer: Self-pay | Admitting: Neurology

## 2015-11-10 VITALS — BP 127/78 | HR 78 | Ht 68.5 in | Wt 181.5 lb

## 2015-11-10 DIAGNOSIS — R430 Anosmia: Secondary | ICD-10-CM

## 2015-11-10 DIAGNOSIS — E538 Deficiency of other specified B group vitamins: Secondary | ICD-10-CM

## 2015-11-10 MED ORDER — ALPRAZOLAM 0.5 MG PO TABS: ORAL_TABLET | ORAL | 0 refills | Status: DC

## 2015-11-10 NOTE — Progress Notes (Signed)
Reason for visit: Anosmia  Referring physician: Dr. Delma Officer is a 65 y.o. male  History of present illness:  George Jacobson is a 65 year old left-handed white male with a history of a sudden onset of change in the sensation of taste. The patient noted that the taste of chicken had altered, it tasted like soap. Potato salad tasted watery, without flavor, and the taste of coffee has changed. The patient indicates that this does not affect all foods, the chicken may smell normal but taste poorly. Some foods taste normal such as cereal. The patient does have some nasal stuffiness in the morning, but not throughout the day, he has no other allergy symptoms. He used to smoke 25 years ago, but not recently. He denies any sinus drainage. He denies any headache, prior history of head trauma, difficulty with numbness or weakness of the extremities or changes in balance. He has been seen through an ENT evaluation, and testing the sense of smell showed a clear abnormality. The patient has a history of renal cell carcinoma, status post nephrectomy, chemotherapy was not used. The patient reports some very minimal changes in memory over the last 10 years, no real progression memory has been noted. The patient denies any issues keeping up with medications, appointments, or having problems with managing finances. He denies any problems with directions with driving. He is sent to this office for an evaluation. The patient has had a CT scan of the head done in August 2017 in [place], [place] that was unremarkable.  Past Medical History:  Diagnosis Date  . Anemia    DEC 2012 -REQUIRED TRANSFUSIONS AND ANEMIA AND TRANSFUSIONS AGAIN FEB 2013--DX WITH "GAVE"  . Angiodysplasia of intestine   . Arthritis    generalized  . Blood transfusion   . Blood transfusion without reported diagnosis 10/2013  . Cancer ([place]) 10/2010   right kidney cancer  . Celiac disease   . Colon polyps    10/2012  . Depression   .  Diverticulosis   . Fibromyalgia   . GERD (gastroesophageal reflux disease)   . GI bleeding   . Headache(784.0)   . Hypertension   . IDA (iron deficiency anemia)   . Pneumonia    DEC 2012 HOSP AT [facility]  . 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  . Sleep apnea    TEST WAS YRS AGO-DID USE CPAP FOR A WHILE--BUT NO LONGER USES--AND DOES NOT FEEL SLEEPY DURING THE DAY TIME ANYMORE  . Tubular adenoma of colon    10/2012  . Urothelial cancer ([place])    Right renal pelvis, resistant to ablations, s/p right nephroureterectomy 10/2010    Past Surgical History:  Procedure Laterality Date  . COLONOSCOPY N/A 10/29/2012   Procedure: COLONOSCOPY;  Surgeon: Irene Shipper, MD;  Location: [facility];  Service: Endoscopy;  Laterality: N/A;  . ESOPHAGOGASTRODUODENOSCOPY  03/23/2011   Procedure: ESOPHAGOGASTRODUODENOSCOPY (EGD);  Surgeon: Zenovia Jarred, MD;  Location: [facility];  Service: Gastroenterology;  Laterality: N/A;  . ESOPHAGOGASTRODUODENOSCOPY N/A 10/29/2012   Procedure: ESOPHAGOGASTRODUODENOSCOPY (EGD);  Surgeon: Irene Shipper, MD;  Location: [facility];  Service: Endoscopy;  Laterality: N/A;  . FOOT SURGERY  2000   both foot sx for bunion  . GIVENS CAPSULE STUDY  03/24/2011   Procedure: GIVENS CAPSULE STUDY;  Surgeon: Zenovia Jarred, MD;  Location: [facility];  Service: Gastroenterology;  Laterality:  N/A;  . [place]   left   . HOT HEMOSTASIS N/A 10/29/2012   Procedure: HOT HEMOSTASIS (ARGON PLASMA COAGULATION/BICAP);  Surgeon: Irene Shipper, MD;  Location: [facility];  Service: Endoscopy;  Laterality: N/A;  . INNER EAR SURGERY  1984   right  ear  . LAPAROSCOPIC NEPHRECTOMY  10/2010   total   . VARICOSE VEIN SURGERY  1986    Family History  Problem Relation Age of Onset  . Hypertension Father   . Breast cancer Sister   . Colon cancer Neg Hx     Social history:  reports  that he quit smoking about 17 years ago. His smoking use included Cigarettes. He has a 25.00 pack-year smoking history. He has never used smokeless tobacco. He reports that he drinks about 3.6 oz of alcohol per week . He reports that he does not use drugs.  Medications:  Prior to Admission medications   Medication Sig Start Date End Date Taking? Authorizing Provider  amitriptyline (ELAVIL) 50 MG tablet Take by mouth. 08/28/15  Yes Historical Provider, MD  Cholecalciferol (VITAMIN D3 PO) Take by mouth.   Yes Historical Provider, MD  DULoxetine (CYMBALTA) 60 MG capsule Take by mouth. 10/22/15  Yes Historical Provider, MD  escitalopram (LEXAPRO) 20 MG tablet Take 20 mg by mouth daily.  02/20/14  Yes Historical Provider, MD  ferrous sulfate 325 (65 FE) MG tablet Take by mouth.   Yes Historical Provider, MD  finasteride (PROSCAR) 5 MG tablet Take 5 mg by mouth daily.   Yes Historical Provider, MD  folic acid (FOLVITE) A999333 MCG tablet Take 800 mcg by mouth daily.   Yes Historical Provider, MD  gabapentin (NEURONTIN) 300 MG capsule Take by mouth.   Yes Historical Provider, MD  HYDROcodone-acetaminophen (NORCO) 7.5-325 MG tablet Take 1 tablet by mouth 4 (four) times daily as needed for moderate pain.   Yes Historical Provider, MD  losartan (COZAAR) 50 MG tablet Take 50 mg by mouth daily.   Yes Historical Provider, MD  Omega-3 Fatty Acids (FISH OIL) 1200 MG CAPS Take 1,200 mg by mouth daily.   Yes Historical Provider, MD  pantoprazole (PROTONIX) 40 MG tablet Take 1 tablet (40 mg total) by mouth daily. 06/01/15  Yes Jerene Bears, MD  polyethylene glycol powder (GLYCOLAX/MIRALAX) powder 17 grams twice daily 06/01/15  Yes Jerene Bears, MD  QUEtiapine (SEROQUEL) 50 MG tablet Take by mouth. 10/22/15  Yes Historical Provider, MD  simvastatin (ZOCOR) 40 MG tablet Take 40 mg by mouth every morning.    Yes Historical Provider, MD  tamsulosin (FLOMAX) 0.4 MG CAPS capsule Take 0.4 mg by mouth daily after supper.   Yes  Historical Provider, MD      Allergies  Allergen Reactions  . Sulfa Antibiotics     Headaches  . Tetracyclines & Related     Nausea    ROS:  Out of a complete 14 system review of symptoms, the patient complains only of the following symptoms, and all other reviewed systems are negative.  Ringing in the ears Snoring Constipation Urination problems Feeling hot Allergies Memory loss, confusion Depression, anxiety, disinterest in activities, suicidal falls  Blood pressure 127/78, pulse 78, height 5' 8.5" (1.74 m), weight 181 lb 8 oz (82.3 kg).  Physical Exam  General: The patient is alert and cooperative at the time of the examination.  Eyes: Pupils are equal, round, and reactive to light. Discs are flat bilaterally.  Neck: The neck is supple, no  carotid bruits are noted.  Respiratory: The respiratory examination is clear.  Cardiovascular: The cardiovascular examination reveals a regular rate and rhythm, no obvious murmurs or rubs are noted.  Skin: Extremities are without significant edema.  Neurologic Exam  Mental status: The patient is alert and oriented x 3 at the time of the examination. The patient has apparent normal recent and remote memory, with an apparently normal attention span and concentration ability.  Cranial nerves: Facial symmetry is present. There is good sensation of the face to pinprick and soft touch bilaterally. The strength of the facial muscles and the muscles to head turning and shoulder shrug are normal bilaterally. Speech is well enunciated, no aphasia or dysarthria is noted. Extraocular movements are full. Visual fields are full. The tongue is midline, and the patient has symmetric elevation of the soft palate. No obvious hearing deficits are noted. Mini-Mental Status Examination done today shows a total score of 29/30.  Motor: The motor testing reveals 5 over 5 strength of all 4 extremities. Good symmetric motor tone is noted  throughout.  Sensory: Sensory testing is intact to pinprick, soft touch, vibration sensation, and position sense on all 4 extremities. No evidence of extinction is noted.  Coordination: Cerebellar testing reveals good finger-nose-finger and heel-to-shin bilaterally.  Gait and station: Gait is normal. Tandem gait is normal. Romberg is negative. No drift is seen.  Reflexes: Deep tendon reflexes are symmetric and normal bilaterally. Toes are downgoing bilaterally.   Assessment/Plan:  1. Anosmia  The patient is noting some change in the sense of taste and smell. The patient appears to have more problems with certain foods than others. He will be sent for blood work today, MRI of the brain will be done. The etiology of the changes not clear.  Jill Alexanders MD 11/10/2015 9:58 AM  [facility] [address] [place], [place] [address]  Phone [phone] Fax [phone]

## 2015-11-12 LAB — ZINC: Zinc: 76 ug/dL (ref 56–134)

## 2015-11-12 LAB — VITAMIN B12: Vitamin B-12: 368 pg/mL (ref 211–946)

## 2015-11-12 LAB — COPPER, SERUM: Copper: 90 ug/dL (ref 72–166)

## 2015-11-12 LAB — SEDIMENTATION RATE: Sed Rate: 8 mm/hr (ref 0–30)

## 2015-11-12 LAB — HIV ANTIBODY (ROUTINE TESTING W REFLEX): HIV Screen 4th Generation wRfx: NONREACTIVE

## 2015-11-23 ENCOUNTER — Telehealth: Payer: Self-pay | Admitting: Neurology

## 2015-11-23 NOTE — Telephone Encounter (Signed)
Patient called to schedule MRI, please call [phone].

## 2015-11-25 NOTE — Telephone Encounter (Signed)
Patient was sent to [facility] (as noted in the order note), would you mind calling the patient to let them know?

## 2015-11-25 NOTE — Telephone Encounter (Signed)
Advised patient that MRI order is at [facility]. The patient will call there and schedule.

## 2015-11-30 ENCOUNTER — Telehealth: Payer: Self-pay

## 2015-11-30 NOTE — Telephone Encounter (Signed)
Pt aware.

## 2015-11-30 NOTE — Telephone Encounter (Signed)
-----   Message from Algernon Huxley, RN sent at 06/02/2015  9:21 AM EDT ----- Regarding: Labs Pt needs labs in October, orders in epic

## 2015-12-07 ENCOUNTER — Ambulatory Visit
Admission: RE | Admit: 2015-12-07 | Discharge: 2015-12-07 | Disposition: A | Discharge status: Home / Self Care | Payer: Medicare Other | Source: Ambulatory Visit | Attending: Neurology | Admitting: Neurology

## 2015-12-07 DIAGNOSIS — R430 Anosmia: Secondary | ICD-10-CM

## 2015-12-09 ENCOUNTER — Telehealth: Payer: Self-pay | Admitting: Neurology

## 2015-12-09 NOTE — Telephone Encounter (Signed)
I called patient. MRI the brain shows minimal white matter changes, nothing that should change in taste or smell. Sinuses are clear. Etiology of the changes not known, the patient does report allergies which could be a cause, certainly medication such as Cozaar, alprazolam, and amitriptyline may be associated with changes in taste. The patient is to go back on his medical history, see if any medications were started around the time that the alteration in taste began.   MRI brain 12/08/15:  IMPRESSION:  Mildly abnormal MRI brain (without) demonstrating: 1. Scattered periventricular and subcortical foci of non-specific gliosis. 2. No acute findings.

## 2015-12-18 ENCOUNTER — Other Ambulatory Visit (INDEPENDENT_AMBULATORY_CARE_PROVIDER_SITE_OTHER): Payer: Medicare Other

## 2015-12-18 DIAGNOSIS — D509 Iron deficiency anemia, unspecified: Secondary | ICD-10-CM | POA: Diagnosis not present

## 2015-12-18 LAB — CBC WITH DIFFERENTIAL/PLATELET
Basophils Absolute: 0 10*3/uL (ref 0.0–0.1)
Basophils Relative: 0.4 % (ref 0.0–3.0)
Eosinophils Absolute: 0.1 10*3/uL (ref 0.0–0.7)
Eosinophils Relative: 1.2 % (ref 0.0–5.0)
HCT: 35.7 % — ABNORMAL LOW (ref 39.0–52.0)
Hemoglobin: 12.4 g/dL — ABNORMAL LOW (ref 13.0–17.0)
Lymphocytes Relative: 22.1 % (ref 12.0–46.0)
Lymphs Abs: 1.4 10*3/uL (ref 0.7–4.0)
MCHC: 34.8 g/dL (ref 30.0–36.0)
MCV: 89.9 fl (ref 78.0–100.0)
Monocytes Absolute: 0.5 10*3/uL (ref 0.1–1.0)
Monocytes Relative: 8.2 % (ref 3.0–12.0)
Neutro Abs: 4.2 10*3/uL (ref 1.4–7.7)
Neutrophils Relative %: 68.1 % (ref 43.0–77.0)
Platelets: 212 10*3/uL (ref 150.0–400.0)
RBC: 3.97 Mil/uL — ABNORMAL LOW (ref 4.22–5.81)
RDW: 12.8 % (ref 11.5–15.5)
WBC: 6.2 10*3/uL (ref 4.0–10.5)

## 2015-12-18 LAB — IBC PANEL
Iron: 127 ug/dL (ref 42–165)
Saturation Ratios: 41.4 % (ref 20.0–50.0)
Transferrin: 219 mg/dL (ref 212.0–360.0)

## 2015-12-18 LAB — FERRITIN: Ferritin: 72.7 ng/mL (ref 22.0–322.0)

## 2015-12-28 ENCOUNTER — Ambulatory Visit: Payer: Medicare Other | Admitting: Neurology

## 2016-01-11 ENCOUNTER — Other Ambulatory Visit: Payer: Self-pay | Admitting: *Deleted

## 2016-01-11 MED ORDER — PANTOPRAZOLE SODIUM 40 MG PO TBEC: 40.0000 mg | DELAYED_RELEASE_TABLET | Freq: Every day | ORAL | 1 refills | Status: DC

## 2016-04-21 ENCOUNTER — Encounter: Payer: Self-pay | Admitting: Physician Assistant

## 2016-04-21 ENCOUNTER — Ambulatory Visit (INDEPENDENT_AMBULATORY_CARE_PROVIDER_SITE_OTHER): Payer: Medicare Other | Admitting: Physician Assistant

## 2016-04-21 VITALS — BP 100/68 | HR 84 | Ht 68.5 in | Wt 176.4 lb

## 2016-04-21 DIAGNOSIS — R1310 Dysphagia, unspecified: Secondary | ICD-10-CM | POA: Diagnosis not present

## 2016-04-21 DIAGNOSIS — K5900 Constipation, unspecified: Secondary | ICD-10-CM | POA: Diagnosis not present

## 2016-04-21 DIAGNOSIS — R140 Abdominal distension (gaseous): Secondary | ICD-10-CM

## 2016-04-21 DIAGNOSIS — K219 Gastro-esophageal reflux disease without esophagitis: Secondary | ICD-10-CM | POA: Diagnosis not present

## 2016-04-21 MED ORDER — POLYETHYLENE GLYCOL 3350 17 GM/SCOOP PO POWD: 1.0000 | Freq: Every day | ORAL | 3 refills | Status: DC

## 2016-04-21 MED ORDER — PANTOPRAZOLE SODIUM 40 MG PO TBEC: 40.0000 mg | DELAYED_RELEASE_TABLET | Freq: Two times a day (BID) | ORAL | 0 refills | Status: DC

## 2016-04-21 NOTE — Patient Instructions (Addendum)
If you are age 68 or older, your body mass index should be between 23-30. Your Body mass index is 26.43 kg/m. If this is out of the aforementioned range listed, please consider follow up with your Primary Care Provider.  If you are age 21 or younger, your body mass index should be between 19-25. Your Body mass index is 26.43 kg/m. If this is out of the aformentioned range listed, please consider follow up with your Primary Care Provider.   You have been scheduled for a Barium Esophogram at [facility] (1st floor of the hospital) on Monday,March 26th at 9:30am. Please arrive 15 minutes prior to your appointment for registration. Make certain not to have anything to eat or drink after midnight  prior to your test. If you need to reschedule for any reason, please contact radiology at [phone] to do so. __________________________________________________________________ A barium swallow is an examination that concentrates on views of the esophagus. This tends to be a double contrast exam (barium and two liquids which, when combined, create a gas to distend the wall of the oesophagus) or single contrast (non-ionic iodine based). The study is usually tailored to your symptoms so a good history is essential. Attention is paid during the study to the form, structure and configuration of the esophagus, looking for functional disorders (such as aspiration, dysphagia, achalasia, motility and reflux) EXAMINATION You may be asked to change into a gown, depending on the type of swallow being performed. A radiologist and radiographer will perform the procedure. The radiologist will advise you of the type of contrast selected for your procedure and direct you during the exam. You will be asked to stand, sit or lie in several different positions and to hold a small amount of fluid in your mouth before being asked to swallow while the imaging is performed .In some instances you may be asked to swallow barium  coated marshmallows to assess the motility of a solid food bolus. The exam can be recorded as a digital or video fluoroscopy procedure. POST PROCEDURE It will take 1-2 days for the barium to pass through your system. To facilitate this, it is important, unless otherwise directed, to increase your fluids for the next 24-48hrs and to resume your normal diet.  This test typically takes about 30 minutes to perform. __________________________________________________________________________________   We have sent the following medications to your pharmacy for you to pick up at your convenience:  Miralax  Pantoprazole  Please follow up with Dr. Hilarie Fredrickson on Friday, April 27th at [facility] may cancel this appointment if it is not needed.  Thank you.

## 2016-04-21 NOTE — Progress Notes (Addendum)
Chief Complaint: Dysphagia, Gas, GERD  HPI:  George Jacobson is a 66 year old Caucasian male with a past medical history of anemia, angiodysplasia of the intestine, right kidney cancer status post nephrectomy, colon polyps, fibromyalgia, GERD, hypertension and others listed below who typically follows with Dr. Hilarie Fredrickson and presents to clinic today with a complaint of dysphagia and excess gas.     Review of chart shows patient's last EGD was completed 11/01/13 for iron deficiency anemia and was normal. Patient was last seen in clinic 06/01/2015 by Dr. Hilarie Fredrickson and was doing well at that time.   Today, the patient presents to clinic and tells me that last Friday he woke up and felt as though something was "lodged in my throat". He tells me that when he would swallow it felt as though there was a "tight spot", he denies anything overtly getting stuck but tells me he has "poor short-term memory", so isn't sure if this started after he took his medicine that morning or not. Patient does tell me that he feels a continued tightness in that area though this does not inhibit his eating at all, just makes him "scared to take my big pills". Patient denies previous symptoms of the same. He does tell me he is taking his Pantoprazole 40 mg once daily and denies any overt reflux symptoms but does tell me that when he swallows he feels a "soreness" in his throat.   Patient also tells me today that over the past month or so he has had excess "gas in my system". He tells me that sometimes this is bad and " pushes up against my breastbone", and is somewhat uncomfortable. The patient tells me that eating tends to make this worse and it does not seem to matter what he eats. He tells me he has been trying to limit his eating in general in order to lose weight for a trip that he and his wife are taking in May and wonders if this has something to do with it. He reports really only eating a small meal for breakfast consisting of fiber cereal  and/or scrambled eggs and tomatoes and a lean cuisine for dinner. He typically only eats fruit during lunchtime. Patient has not tried anything for this. Patient does tell me that he has only been using his MiraLAX every other day or "when I remember", and does admit to somewhat irregular stools, telling me that it can be a few days in between bowel movements.   Patient denies fever, chills, blood in his stool, melena, weight loss, fatigue, anorexia, nausea, vomiting or symptoms that awaken him at night.    Past Medical History:  Diagnosis Date  . Anemia    DEC 2012 -REQUIRED TRANSFUSIONS AND ANEMIA AND TRANSFUSIONS AGAIN FEB 2013--DX WITH "GAVE"  . Angiodysplasia of intestine   . Arthritis    generalized  . Blood transfusion   . Blood transfusion without reported diagnosis 10/2013  . Cancer ([place]) 10/2010   right kidney cancer  . Celiac disease   . Colon polyps    10/2012  . Depression   . Diverticulosis   . Fibromyalgia   . GERD (gastroesophageal reflux disease)   . GI bleeding   . Headache(784.0)   . Hypertension   . IDA (iron deficiency anemia)   . Pneumonia    DEC 2012 HOSP AT [place]  . 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  . Sleep apnea    TEST WAS YRS AGO-DID USE CPAP FOR A WHILE--BUT NO LONGER USES--AND DOES NOT FEEL SLEEPY DURING THE DAY TIME ANYMORE  . Tubular adenoma of colon    10/2012  . 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 COLONOSCOPY N/A 10/29/2012   Procedure: COLONOSCOPY;  Surgeon: Irene Shipper, MD;  Location: [facility];  Service: Endoscopy;  Laterality: N/A;  . ESOPHAGOGASTRODUODENOSCOPY  03/23/2011   Procedure: ESOPHAGOGASTRODUODENOSCOPY (EGD);  Surgeon: Zenovia Jarred, MD;  Location: [facility];  Service: Gastroenterology;   Laterality: N/A;  . ESOPHAGOGASTRODUODENOSCOPY N/A 10/29/2012   Procedure: ESOPHAGOGASTRODUODENOSCOPY (EGD);  Surgeon: Irene Shipper, MD;  Location: [facility];  Service: Endoscopy;  Laterality: N/A;  . FOOT SURGERY  2000   both foot sx for bunion  . GIVENS CAPSULE STUDY  03/24/2011   Procedure: GIVENS CAPSULE STUDY;  Surgeon: Zenovia Jarred, MD;  Location: [facility];  Service: Gastroenterology;  Laterality: N/A;  . [place]   left   . HOT HEMOSTASIS N/A 10/29/2012   Procedure: HOT HEMOSTASIS (ARGON PLASMA COAGULATION/BICAP);  Surgeon: Irene Shipper, MD;  Location: [facility];  Service: Endoscopy;  Laterality: N/A;  . INNER EAR SURGERY  1984   right  ear  . LAPAROSCOPIC NEPHRECTOMY  10/2010   total   . VARICOSE VEIN SURGERY  1986    Current Outpatient Prescriptions  Medication Sig Dispense Refill  . ALPRAZolam (XANAX) 0.5 MG tablet Take 2 tablets approximately 45 minutes prior to the MRI study, take a third tablet if needed. 3 tablet 0  . amitriptyline (ELAVIL) 50 MG tablet Take by mouth.    . Cholecalciferol (VITAMIN D3 PO) Take by mouth.    . DULoxetine (CYMBALTA) 60 MG capsule Take by mouth.    . ferrous sulfate 325 (65 FE) MG tablet Take by mouth.    . finasteride (PROSCAR) 5 MG tablet Take 5 mg by mouth daily.    . folic acid (FOLVITE) 683 MCG tablet Take 800 mcg by mouth daily.    [name] gabapentin (NEURONTIN) 300 MG capsule Take by mouth.    [name] HYDROcodone-acetaminophen (NORCO) 7.5-325 MG tablet Take 1 tablet by mouth 4 (four) times daily as needed for moderate pain.    [name] losartan (COZAAR) 50 MG tablet Take 50 mg by mouth daily.    . Multiple Vitamins-Minerals (QC MENS DAILY MULTIVITAMIN PO) Take 1 tablet by mouth daily.    . Omega-3 Fatty Acids (FISH OIL) 1200 MG CAPS Take 1,200 mg by mouth daily.    . pantoprazole (PROTONIX) 40 MG tablet Take 1 tablet (40 mg total) by mouth daily. 90 tablet 1  . polyethylene glycol powder (GLYCOLAX/MIRALAX) powder 17 grams twice daily 1050  g 10  . QUEtiapine (SEROQUEL) 50 MG tablet Take by mouth.    . simvastatin (ZOCOR) 40 MG tablet Take 40 mg by mouth every morning.     . tamsulosin (FLOMAX) 0.4 MG CAPS capsule Take 0.4 mg by mouth daily after supper.     No current facility-administered medications for this visit.     Allergies as of 04/21/2016 - Review Complete 04/21/2016  Allergen Reaction Noted  . Sulfa antibiotics  03/21/2011  . Tetracyclines & related  03/21/2011    Family History  Problem Relation Age of Onset  . Hypertension Father   . Breast cancer Sister   .  Colon cancer Neg Hx     Social History   Social History  . Marital status: Married    Spouse name: N/A  . Number of children: 2  . Years of education: Some college   Occupational History  . 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 Smoking status: Former Smoker    Packs/day: 1.00    Years: 25.00    Types: Cigarettes    Quit date: 02/14/1998  . Smokeless tobacco: Never Used  . Alcohol use 3.6 oz/week    6 Cans of beer per week  . Drug use: No  . Sexual activity: Not on file   Other Topics Concern  . Not on file   Social History Narrative   Lives in [place] with his wife, has two distributing businesses and at baseline still quite active. Semi-retired. Have two children, and 2 grandchildren. No cane or walker. George Jacobson = wife.    Left-handed   Caffeine: 2-3 cups of coffee and about 4 cups of tea per day    Review of Systems:    Constitutional: No weight loss, fever or chills Skin: No rash Cardiovascular: No chest pain Respiratory: No SOB Gastrointestinal: See HPI and otherwise negative Genitourinary: No dysuria Neurological: No headache Musculoskeletal: No new muscle or joint pain Hematologic: No bleeding Psychiatric: No history of depression or anxiety   Physical Exam:  Vital signs: BP 100/68   Pulse 84   Ht 5' 8.5" (1.74 m)   Wt 176 lb 6.4 oz (80 kg)   BMI 26.43 kg/m   Constitutional:   Very  Pleasant Caucasian male appears to be in NAD, Well developed, Well nourished, alert and cooperative Head:  Normocephalic and atraumatic. Eyes:   PEERL, EOMI. No icterus. Conjunctiva pink. Ears:  Normal auditory acuity. Neck:  Supple Throat: Oral cavity and pharynx without inflammation, swelling or lesion.  Respiratory: Respirations even and unlabored. Lungs clear to auscultation bilaterally.   No wheezes, crackles, or rhonchi.  Cardiovascular: Normal S1, S2. No MRG. Regular rate and rhythm. No peripheral edema, cyanosis or pallor.  Gastrointestinal:  Soft, nondistended, nontender. No rebound or guarding.Increased BS all 4 quadrants. No appreciable masses or hepatomegaly. Rectal:  Not performed.  Msk:  Symmetrical without gross deformities. Without edema, no deformity or joint abnormality.  Neurologic:  Alert and  oriented x4;  grossly normal neurologically.  Skin:   Dry and intact without significant lesions or rashes. Psychiatric:  Demonstrates good judgement and reason without abnormal affect or behaviors.  No recent labs or imaging.  Assessment: 1. Dysphagia/odynophagia: Acute onset Friday of last week per the patient, though he does tell me he has a poor short-term memory, he cannot remember anything getting stuck but does feel a "tightness when he swallows", this is making him nervous to take big pills; question esophagitis versus stricture versus other 2. Reflux: Mostly controlled on Pantoprazole 40 mg daily 3. Constipation: Chronic for the patient, he has been prescribed MiraLAX daily before but is only taking this every other day or when he remembers 4. Gas: Increased over the past month or so since patient has been eating a healthier diet/eating less  Plan: 1. Consider relation of gas to constipation and change in diet recently. Recommend the patient use his MiraLAX once daily. 2. Increased patient's Pantoprazole to 40 mg twice a day for the next 2 weeks, did provide him with a 30  day prescription. 3. Ordered a barium swallow study with tablet to further evaluate dysphagia/odynophagia,  if sign of stricture will need to discuss endoscopy  4. Patient to follow in clinic per recommendations after swallowing study above, did go ahead and make him an appointment with Dr. Hilarie Fredrickson at his next available time as the patient enjoys seeing him.  Ellouise Newer, PA-C [facility] 04/21/2016, 9:09 AM  Cc: [name], MD   Addendum: Reviewed and agree with initial management. Jerene Bears, MD

## 2016-05-09 ENCOUNTER — Telehealth: Payer: Self-pay | Admitting: Physician Assistant

## 2016-05-09 ENCOUNTER — Ambulatory Visit (HOSPITAL_COMMUNITY)
Admission: RE | Admit: 2016-05-09 | Discharge: 2016-05-09 | Disposition: A | Discharge status: Home / Self Care | Payer: Medicare Other | Source: Ambulatory Visit | Attending: Physician Assistant | Admitting: Physician Assistant

## 2016-05-09 DIAGNOSIS — R1310 Dysphagia, unspecified: Secondary | ICD-10-CM | POA: Diagnosis present

## 2016-05-09 DIAGNOSIS — K219 Gastro-esophageal reflux disease without esophagitis: Secondary | ICD-10-CM

## 2016-05-09 NOTE — Telephone Encounter (Signed)
Patient says he is doing better.  He does not want to proceed with EGD.  He will call if he gets worse again.

## 2016-05-09 NOTE — Telephone Encounter (Signed)
-----   Message from Levin Erp, [place] sent at 05/09/2016 11:18 AM EDT ----- Normal DG esophagus. If patient is continuing to feel like things are getting stuck in his throat, will need to arrange for EGD in the [place] with Dr. Hilarie Fredrickson. Thank you-JLL

## 2016-05-18 ENCOUNTER — Other Ambulatory Visit: Payer: Self-pay | Admitting: Emergency Medicine

## 2016-05-18 MED ORDER — PANTOPRAZOLE SODIUM 40 MG PO TBEC: 40.0000 mg | DELAYED_RELEASE_TABLET | Freq: Two times a day (BID) | ORAL | 3 refills | Status: DC

## 2016-05-18 NOTE — Telephone Encounter (Signed)
Patients pharmacy requested 90 day supply of Pantoprazole. Ok per [employer], PA-C.

## 2016-06-10 ENCOUNTER — Ambulatory Visit: Payer: Medicare Other | Admitting: Internal Medicine

## 2016-06-10 ENCOUNTER — Telehealth: Payer: Self-pay | Admitting: Internal Medicine

## 2016-06-10 NOTE — Telephone Encounter (Signed)
No charge. 

## 2016-07-18 ENCOUNTER — Other Ambulatory Visit: Payer: Self-pay | Admitting: Internal Medicine

## 2016-10-15 ENCOUNTER — Other Ambulatory Visit: Payer: Self-pay | Admitting: Internal Medicine

## 2017-03-23 ENCOUNTER — Other Ambulatory Visit: Payer: Self-pay | Admitting: Internal Medicine

## 2017-03-28 ENCOUNTER — Other Ambulatory Visit: Payer: Self-pay | Admitting: Internal Medicine

## 2017-04-14 ENCOUNTER — Telehealth: Payer: Self-pay | Admitting: Internal Medicine

## 2017-04-14 MED ORDER — POLYETHYLENE GLYCOL 3350 17 GM/SCOOP PO POWD: ORAL | 1 refills | Status: AC

## 2017-04-14 NOTE — Telephone Encounter (Signed)
Rx sent 

## 2017-05-14 ENCOUNTER — Other Ambulatory Visit: Payer: Self-pay | Admitting: Physician Assistant

## 2017-06-26 ENCOUNTER — Ambulatory Visit (INDEPENDENT_AMBULATORY_CARE_PROVIDER_SITE_OTHER): Payer: Medicare Other | Admitting: Internal Medicine

## 2017-06-26 ENCOUNTER — Encounter (INDEPENDENT_AMBULATORY_CARE_PROVIDER_SITE_OTHER): Payer: Self-pay

## 2017-06-26 ENCOUNTER — Encounter: Payer: Self-pay | Admitting: Internal Medicine

## 2017-06-26 ENCOUNTER — Other Ambulatory Visit (INDEPENDENT_AMBULATORY_CARE_PROVIDER_SITE_OTHER): Payer: Medicare Other

## 2017-06-26 VITALS — BP 130/74 | HR 70 | Ht 69.0 in | Wt 172.0 lb

## 2017-06-26 DIAGNOSIS — Z86010 Personal history of colonic polyps: Secondary | ICD-10-CM

## 2017-06-26 DIAGNOSIS — D509 Iron deficiency anemia, unspecified: Secondary | ICD-10-CM | POA: Diagnosis not present

## 2017-06-26 DIAGNOSIS — K5909 Other constipation: Secondary | ICD-10-CM

## 2017-06-26 DIAGNOSIS — K219 Gastro-esophageal reflux disease without esophagitis: Secondary | ICD-10-CM

## 2017-06-26 DIAGNOSIS — D500 Iron deficiency anemia secondary to blood loss (chronic): Secondary | ICD-10-CM

## 2017-06-26 LAB — CBC WITH DIFFERENTIAL/PLATELET
Basophils Absolute: 0 10*3/uL (ref 0.0–0.1)
Basophils Relative: 0.9 % (ref 0.0–3.0)
Eosinophils Absolute: 0.1 10*3/uL (ref 0.0–0.7)
Eosinophils Relative: 2.6 % (ref 0.0–5.0)
HCT: 35.3 % — ABNORMAL LOW (ref 39.0–52.0)
Hemoglobin: 12.6 g/dL — ABNORMAL LOW (ref 13.0–17.0)
Lymphocytes Relative: 29 % (ref 12.0–46.0)
Lymphs Abs: 1.4 10*3/uL (ref 0.7–4.0)
MCHC: 35.5 g/dL (ref 30.0–36.0)
MCV: 90.2 fl (ref 78.0–100.0)
Monocytes Absolute: 0.5 10*3/uL (ref 0.1–1.0)
Monocytes Relative: 10.6 % (ref 3.0–12.0)
Neutro Abs: 2.8 10*3/uL (ref 1.4–7.7)
Neutrophils Relative %: 56.9 % (ref 43.0–77.0)
Platelets: 250 10*3/uL (ref 150.0–400.0)
RBC: 3.92 Mil/uL — ABNORMAL LOW (ref 4.22–5.81)
RDW: 13.6 % (ref 11.5–15.5)
WBC: 5 10*3/uL (ref 4.0–10.5)

## 2017-06-26 LAB — IBC PANEL
Iron: 110 ug/dL (ref 42–165)
Saturation Ratios: 41.6 % (ref 20.0–50.0)
Transferrin: 189 mg/dL — ABNORMAL LOW (ref 212.0–360.0)

## 2017-06-26 NOTE — Patient Instructions (Addendum)
Decrease Protonix to once daily dosing.  You will be due for a recall colonoscopy in 10/2017. We will send you a reminder in the mail when it gets closer to that time.  Take 1/2 dose (9 grams) alternating with 1 dose (17 grams) miralax every other day.  Please follow up with Dr Hilarie Fredrickson in 1 year.  Your provider has requested that you go to the basement level for lab work before leaving today. Press "B" on the elevator. The lab is located at the first door on the left as you exit the elevator.  If you are age 76 or older, your body mass index should be between 23-30. Your Body mass index is 25.4 kg/m. If this is out of the aforementioned range listed, please consider follow up with your Primary Care Provider.  If you are age 65 or younger, your body mass index should be between 19-25. Your Body mass index is 25.4 kg/m. If this is out of the aformentioned range listed, please consider follow up with your Primary Care Provider.

## 2017-06-26 NOTE — Progress Notes (Signed)
Subjective:    Patient ID: George Jacobson, male    DOB: 07/23/1950, 67 y.o.   MRN: [id number]  HPI George Jacobson is a 67 yo male with PMH of IDA, angiodysplasia of the intestine, RCC status post right nephrectomy, history of adenomatous colon polyp, GERD, fibromyalgia, hypertension who is here for follow-up.  He was last seen just over 1 year ago by Ellouise Newer, PA-C.  He is here alone today.  He reports he is been feeling well though he was recently treated for pneumonia.  He did 10 days of antibiotics and symptoms have improved.  From a GI perspective he is doing well.  He is been taking pantoprazole 40 mg twice daily and currently denies issues with GERD and indigestion.  Last year he was having some dysphagia and a barium esophagram was performed which was normal.  No further dysphagia recently.  No abdominal pain.  Still issues with constipation though he is been using MiraLAX 17 g daily which is worked very well.  He can have a bowel movement up to 2 times a day, have a day where he does not go.  A couple of times he has had some mild fecal smearing which she feels is related to MiraLAX.  No blood in his stool or melena.  His blood counts are watch closely by his primary care doctor every 6 months.  He also follows with nephrology since his nephrectomy and has labs with nephrology twice yearly.  He continues oral iron on a daily basis.  No recent melena, blood in his stool, hematochezia, easy bruising.   Review of Systems As per HPI, otherwise negative  Current Medications, Allergies, Past Medical History, Past Surgical History, Family History and Social History were reviewed in [employer] record.     Objective:   Physical Exam BP 130/74   Pulse 70   Ht 5\' 9"  (1.753 m)   Wt 172 lb (78 kg)   BMI 25.40 kg/m  Constitutional: Well-developed and well-nourished. No distress. HEENT: Normocephalic and atraumatic.  Conjunctivae are normal.  No scleral  icterus. Neck: Neck supple. Trachea midline. Cardiovascular: Normal rate, regular rhythm and intact distal pulses. No M/R/G Pulmonary/chest: Effort normal and breath sounds normal. No wheezing, rales or rhonchi. Abdominal: Soft, nontender, nondistended. Bowel sounds active throughout. There are no masses palpable. No hepatosplenomegaly. Extremities: no clubbing, cyanosis, or edema Neurological: Alert and oriented to person place and time. Skin: Skin is warm and dry. Psychiatric: Normal mood and affect. Behavior is normal.  ESOPHOGRAM / BARIUM SWALLOW / BARIUM TABLET STUDY   TECHNIQUE: Combined double contrast and single contrast examination performed using effervescent crystals, thick barium liquid, and thin barium liquid. The patient was observed with fluoroscopy swallowing a 13 mm barium sulphate tablet.   FLUOROSCOPY TIME:  Fluoroscopy Time:  1.7 minutes   Radiation Exposure Index (if provided by the fluoroscopic device): 26.7 mGy   Number of Acquired Spot Images: 0   COMPARISON:  None.   FINDINGS: Normal esophageal peristalsis. No fixed stricture, fold thickening or mass. No reflux with the water siphon maneuver. The patient swallowed a 13 mm barium tablet which freely passed into the stomach.   IMPRESSION: Normal study.     Electronically Signed   By: Rolm Baptise M.D.   On: 05/09/2016 09:57  CBC    Component Value Date/Time   WBC 5.0 06/26/2017 1530   RBC 3.92 (L) 06/26/2017 1530   HGB 12.6 (L) 06/26/2017 1530   HCT  35.3 (L) 06/26/2017 1530   PLT 250.0 06/26/2017 1530   MCV 90.2 06/26/2017 1530   MCH 30.8 10/09/2013 0515   MCHC 35.5 06/26/2017 1530   RDW 13.6 06/26/2017 1530   LYMPHSABS 1.4 06/26/2017 1530   MONOABS 0.5 06/26/2017 1530   EOSABS 0.1 06/26/2017 1530   BASOSABS 0.0 06/26/2017 1530   Iron/TIBC/Ferritin/ %Sat    Component Value Date/Time   IRON 110 06/26/2017 1530   TIBC 321 10/31/2013 1516   FERRITIN 72.7 12/18/2015 1620   IRONPCTSAT  41.6 06/26/2017 1530   IRONPCTSAT 7 (L) 10/31/2013 1516      Assessment & Plan:  67 yo male with PMH of IDA, angiodysplasia of the intestine, RCC status post right nephrectomy, history of adenomatous colon polyp, GERD, fibromyalgia, hypertension who is here for follow-up  1. IDA --history of iron deficiency felt secondary to small bowel angiodysplastic lesions.  Very good response to oral iron.  Repeat blood counts were performed today and his hemoglobin is near normal at 12.6.  MCV is normal.  Iron studies look good.  Continue oral iron once daily.  2.  GERD --no further alarm symptoms and no dysphagia.  Esophagram was reassuring.  I will back him down to pantoprazole 40 mg once daily.  If he has recurrent issues with indigestion, heartburn or dysphagia after reducing the dose he is asked to notify me.  He voices understanding  3.  Constipation --responsive to MiraLAX, I am going to decrease to 8 g alternating with 17 g daily.  Hopefully this will prevent the days when he is having looser stools with intermittent mild fecal seepage.  4.  History of adenomatous colon polyp --surveillance colonoscopy due later this year in September.  We discussed surveillance colonoscopy including the risk, benefits and alternatives and he is agreeable and wishes to proceed at that time.  25 minutes spent with the patient today. Greater than 50% was spent in counseling and coordination of care with the patient

## 2017-08-17 ENCOUNTER — Other Ambulatory Visit: Payer: Self-pay | Admitting: Internal Medicine

## 2017-09-21 ENCOUNTER — Encounter: Payer: Self-pay | Admitting: Internal Medicine

## 2018-02-16 ENCOUNTER — Encounter: Payer: Self-pay | Admitting: Internal Medicine

## 2018-03-20 ENCOUNTER — Ambulatory Visit (AMBULATORY_SURGERY_CENTER): Payer: Self-pay | Admitting: *Deleted

## 2018-03-20 VITALS — Ht 70.0 in | Wt 165.0 lb

## 2018-03-20 DIAGNOSIS — Z86010 Personal history of colonic polyps: Secondary | ICD-10-CM

## 2018-03-20 MED ORDER — NA SULFATE-K SULFATE-MG SULF 17.5-3.13-1.6 GM/177ML PO SOLN: ORAL | 0 refills | Status: DC

## 2018-03-20 NOTE — Progress Notes (Signed)
Patient denies any allergies to eggs or soy. Patient denies any problems with anesthesia/sedation. Patient denies any oxygen use at home. Patient denies taking any diet/weight loss medications or blood thinners. EMMI education offered, pt declined.  

## 2018-04-03 ENCOUNTER — Other Ambulatory Visit (INDEPENDENT_AMBULATORY_CARE_PROVIDER_SITE_OTHER): Payer: Medicare Other

## 2018-04-03 ENCOUNTER — Ambulatory Visit (AMBULATORY_SURGERY_CENTER): Payer: Medicare Other | Admitting: Internal Medicine

## 2018-04-03 ENCOUNTER — Encounter: Payer: Self-pay | Admitting: Internal Medicine

## 2018-04-03 VITALS — BP 115/65 | HR 56 | Temp 97.8°F | Resp 22 | Ht 70.0 in | Wt 165.0 lb

## 2018-04-03 DIAGNOSIS — D509 Iron deficiency anemia, unspecified: Secondary | ICD-10-CM

## 2018-04-03 DIAGNOSIS — K219 Gastro-esophageal reflux disease without esophagitis: Secondary | ICD-10-CM

## 2018-04-03 DIAGNOSIS — Z86010 Personal history of colonic polyps: Secondary | ICD-10-CM | POA: Diagnosis not present

## 2018-04-03 DIAGNOSIS — D125 Benign neoplasm of sigmoid colon: Secondary | ICD-10-CM

## 2018-04-03 LAB — CBC WITH DIFFERENTIAL/PLATELET
Basophils Absolute: 0 10*3/uL (ref 0.0–0.1)
Basophils Relative: 1 % (ref 0.0–3.0)
Eosinophils Absolute: 0.1 10*3/uL (ref 0.0–0.7)
Eosinophils Relative: 2 % (ref 0.0–5.0)
HCT: 33.1 % — ABNORMAL LOW (ref 39.0–52.0)
Hemoglobin: 11.8 g/dL — ABNORMAL LOW (ref 13.0–17.0)
Lymphocytes Relative: 24.8 % (ref 12.0–46.0)
Lymphs Abs: 0.8 10*3/uL (ref 0.7–4.0)
MCHC: 35.7 g/dL (ref 30.0–36.0)
MCV: 89 fl (ref 78.0–100.0)
Monocytes Absolute: 0.3 10*3/uL (ref 0.1–1.0)
Monocytes Relative: 8.6 % (ref 3.0–12.0)
Neutro Abs: 2 10*3/uL (ref 1.4–7.7)
Neutrophils Relative %: 63.6 % (ref 43.0–77.0)
Platelets: 173 10*3/uL (ref 150.0–400.0)
RBC: 3.72 Mil/uL — ABNORMAL LOW (ref 4.22–5.81)
RDW: 12.2 % (ref 11.5–15.5)
WBC: 3.2 10*3/uL — ABNORMAL LOW (ref 4.0–10.5)

## 2018-04-03 LAB — IBC + FERRITIN
Ferritin: 182.7 ng/mL (ref 22.0–322.0)
Iron: 51 ug/dL (ref 42–165)
Saturation Ratios: 24.3 % (ref 20.0–50.0)
Transferrin: 150 mg/dL — ABNORMAL LOW (ref 212.0–360.0)

## 2018-04-03 MED ORDER — PANTOPRAZOLE SODIUM 40 MG PO TBEC: 40.0000 mg | DELAYED_RELEASE_TABLET | Freq: Two times a day (BID) | ORAL | 3 refills | Status: DC

## 2018-04-03 MED ORDER — SODIUM CHLORIDE 0.9 % IV SOLN: 500.0000 mL | Freq: Once | INTRAVENOUS | Status: DC

## 2018-04-03 NOTE — Progress Notes (Signed)
To PACU, VSS. Report to [email or web address] 

## 2018-04-03 NOTE — Op Note (Signed)
[facility] Patient Name: George Jacobson Procedure Date: 04/03/2018 8:08 AM MRN: [id number] Endoscopist: Jerene Bears , MD Age: 68 Referring MD:  Date of Birth: 06/09/1950 Gender: Male Account #: [id number] Procedure:                Colonoscopy Indications:              Surveillance: Personal history of adenomatous                            polyps on last colonoscopy 5 years ago Medicines:                Monitored Anesthesia Care Procedure:                Pre-Anesthesia Assessment:                           - Prior to the procedure, a History and Physical                            was performed, and patient medications and                            allergies were reviewed. The patient's tolerance of                            previous anesthesia was also reviewed. The risks                            and benefits of the procedure and the sedation                            options and risks were discussed with the patient.                            All questions were answered, and informed consent                            was obtained. Prior Anticoagulants: The patient has                            taken no previous anticoagulant or antiplatelet                            agents. ASA Grade Assessment: II - A patient with                            mild systemic disease. After reviewing the risks                            and benefits, the patient was deemed in                            satisfactory condition to undergo the procedure.  After obtaining informed consent, the colonoscope                            was passed under direct vision. Throughout the                            procedure, the patient's blood pressure, pulse, and                            oxygen saturations were monitored continuously. The                            Colonoscope was introduced through the anus and                            advanced to the cecum,  identified by appendiceal                            orifice and ileocecal valve. The colonoscopy was                            performed without difficulty. The patient tolerated                            the procedure well. The quality of the bowel                            preparation was good. The ileocecal valve,                            appendiceal orifice, and rectum were photographed. Scope In: 8:33:34 AM Scope Out: 8:55:15 AM Scope Withdrawal Time: 0 hours 16 minutes 22 seconds  Total Procedure Duration: 0 hours 21 minutes 41 seconds  Findings:                 The digital rectal exam was normal.                           A 4 mm polyp was found in the sigmoid colon. The                            polyp was sessile. The polyp was removed with a                            cold snare. Resection and retrieval were complete.                           Internal hemorrhoids were found during                            retroflexion. The hemorrhoids were small.                           The exam was otherwise without abnormality. Complications:  No immediate complications. Estimated Blood Loss:     Estimated blood loss was minimal. Impression:               - One 4 mm polyp in the sigmoid colon, removed with                            a cold snare. Resected and retrieved.                           - Small internal hemorrhoids.                           - The examination was otherwise normal. Recommendation:           - Patient has a contact number available for                            emergencies. The signs and symptoms of potential                            delayed complications were discussed with the                            patient. Return to normal activities tomorrow.                            Written discharge instructions were provided to the                            patient.                           - Resume previous diet.                           -  Continue present medications.                           - Await pathology results.                           - Repeat colonoscopy is recommended for                            surveillance. The colonoscopy date will be                            determined after pathology results from today's                            exam become available for review. Jerene Bears, MD 04/03/2018 9:01:04 AM This report has been signed electronically.

## 2018-04-03 NOTE — Patient Instructions (Signed)
YOU HAD AN ENDOSCOPIC PROCEDURE TODAY AT [facility]:   Refer to the procedure report that was given to you for any specific questions about what was found during the examination.  If the procedure report does not answer your questions, please call your gastroenterologist to clarify.  If you requested that your care partner not be given the details of your procedure findings, then the procedure report has been included in a sealed envelope for you to review at your convenience later.  YOU SHOULD EXPECT: Some feelings of bloating in the abdomen. Passage of more gas than usual.  Walking can help get rid of the air that was put into your GI tract during the procedure and reduce the bloating. If you had a lower endoscopy (such as a colonoscopy or flexible sigmoidoscopy) you may notice spotting of blood in your stool or on the toilet paper. If you underwent a bowel prep for your procedure, you may not have a normal bowel movement for a few days.  Please Note:  You might notice some irritation and congestion in your nose or some drainage.  This is from the oxygen used during your procedure.  There is no need for concern and it should clear up in a day or so.  SYMPTOMS TO REPORT IMMEDIATELY:   Following lower endoscopy (colonoscopy or flexible sigmoidoscopy):  Excessive amounts of blood in the stool  Significant tenderness or worsening of abdominal pains  Swelling of the abdomen that is new, acute  Fever of 100F or higher    For urgent or emergent issues, a gastroenterologist can be reached at any hour by calling [phone].   DIET:  We do recommend a small meal at first, but then you may proceed to your regular diet.  Drink plenty of fluids but you should avoid alcoholic beverages for 24 hours.  ACTIVITY:  You should plan to take it easy for the rest of today and you should NOT DRIVE or use heavy machinery until tomorrow (because of the sedation medicines used during the test).     FOLLOW UP: Our staff will call the number listed on your records the next business day following your procedure to check on you and address any questions or concerns that you may have regarding the information given to you following your procedure. If we do not reach you, we will leave a message.  However, if you are feeling well and you are not experiencing any problems, there is no need to return our call.  We will assume that you have returned to your regular daily activities without incident.  If any biopsies were taken you will be contacted by phone or by letter within the next 1-3 weeks.  Please call us at [phone] if you have not heard about the biopsies in 3 weeks.    SIGNATURES/CONFIDENTIALITY: You and/or your care partner have signed paperwork which will be entered into your electronic medical record.  These signatures attest to the fact that that the information above on your After Visit Summary has been reviewed and is understood.  Full responsibility of the confidentiality of this discharge information lies with you and/or your care-partner.   Handouts were given to your care partner on polyps and hemorrhoids. Blood work to be done on discharge CBC, Iron & Ferritin in the Lab in the basement. Increase Protonix 40 to 2 x per day.  Rx was sent to your pharmacy. You may resume your current medications today. Await biopsy results.  Please call if any questions or concerns.

## 2018-04-03 NOTE — Progress Notes (Signed)
Vocal order per Dr. Hilarie Fredrickson pt has been taking PANTOPRAZOLE 40 mg BID and is healing his sx better.  Per Dr. Hilarie Fredrickson sent in PANTOPRAZOLE 40 mh BID #180 Refill x3 to [facility] [address].  Also pt to go to the lab for CBC, Iron & Ferritin on discharge today.  He will place order in Epic.  Pt to lab on discharge. No complaints noted in the recovery room. maw

## 2018-04-03 NOTE — Progress Notes (Signed)
Pt's states no medical or surgical changes since previsit or office visit. 

## 2018-04-04 ENCOUNTER — Other Ambulatory Visit: Payer: Self-pay

## 2018-04-04 ENCOUNTER — Telehealth: Payer: Self-pay | Admitting: *Deleted

## 2018-04-04 DIAGNOSIS — D509 Iron deficiency anemia, unspecified: Secondary | ICD-10-CM

## 2018-04-04 NOTE — Telephone Encounter (Signed)
  Follow up Call-  Call back number 04/03/2018  Post procedure Call Back phone  # [phone]  Permission to leave phone message Yes  Some recent data might be hidden     Patient questions:  Do you have a fever, pain , or abdominal swelling? No. Pain Score  0 *  Have you tolerated food without any problems? Yes.    Have you been able to return to your normal activities? Yes.    Do you have any questions about your discharge instructions: Diet   No. Medications  No. Follow up visit  No.  Do you have questions or concerns about your Care? No.  Actions: * If pain score is 4 or above: No action needed, pain <4.

## 2018-04-06 ENCOUNTER — Encounter: Payer: Self-pay | Admitting: Internal Medicine

## 2018-05-02 ENCOUNTER — Other Ambulatory Visit: Payer: Self-pay | Admitting: Internal Medicine

## 2018-05-02 DIAGNOSIS — K219 Gastro-esophageal reflux disease without esophagitis: Secondary | ICD-10-CM

## 2018-08-22 IMAGING — RF DG ESOPHAGUS
8 series · 17 of 24 positions shown · non-contrast
Comparison: None.

CLINICAL DATA: Dysphagia.  History of reflux.

EXAM:
ESOPHOGRAM / BARIUM SWALLOW / BARIUM TABLET STUDY
TECHNIQUE: Combined double contrast and single contrast examination performed
using effervescent crystals, thick barium liquid, and thin barium
liquid. The patient was observed with fluoroscopy swallowing a 13 mm
barium sulphate tablet.
FLUOROSCOPY TIME:  Fluoroscopy Time:  1.7 minutes
Radiation Exposure Index (if provided by the fluoroscopic device):
26.7 mGy
Number of Acquired Spot Images: 0

[Series 1: cp_standard · 0.34mm/px · 2 of 91 frames shown (1 of 8)]
[frame 1/91]
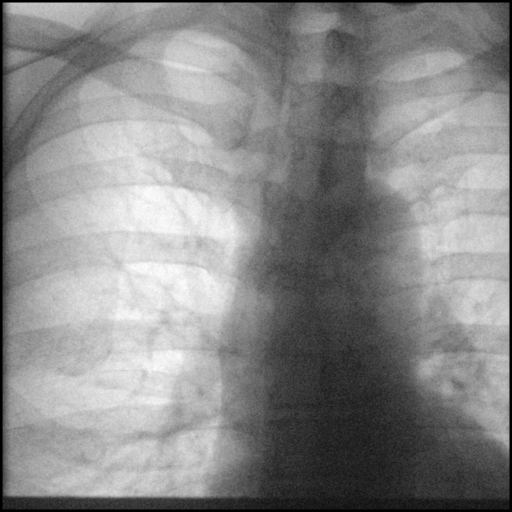
[frame 46/91]
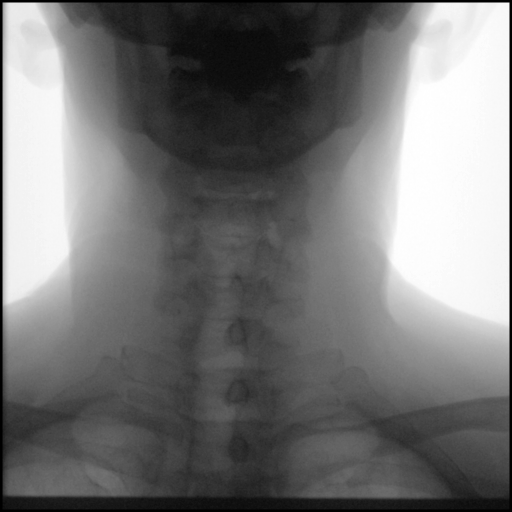

[Series 2: cp_standard · 0.34mm/px · 3 of 56 frames shown (2 of 8)]
[frame 5/56]
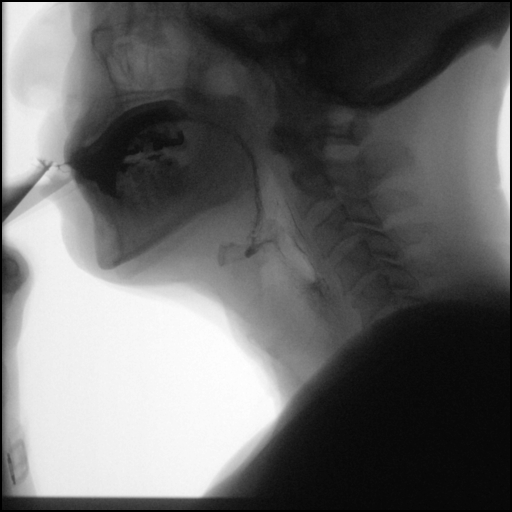
[frame 9/56]
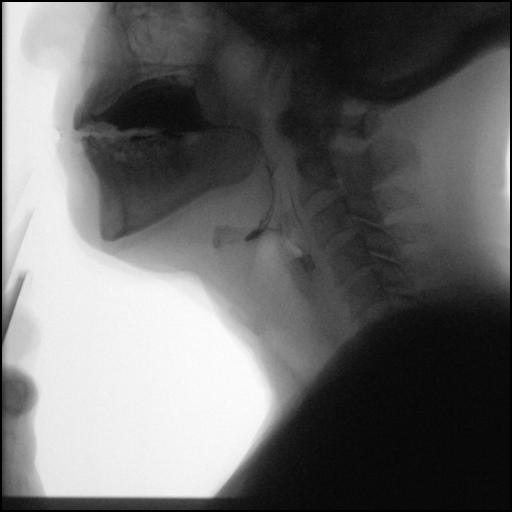
[frame 48/56]
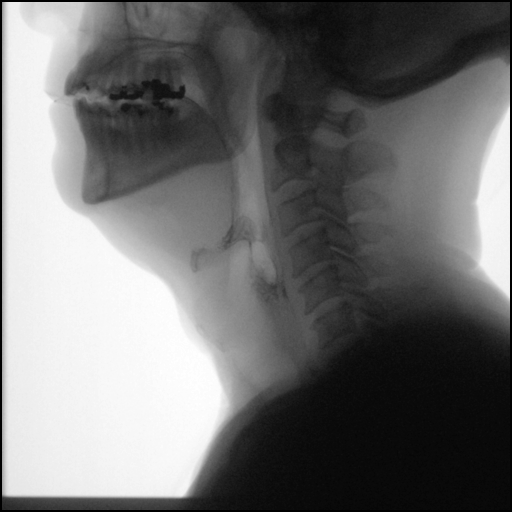

[Series 3: cp_standard · 0.34mm/px · 2 of 248 frames shown (3 of 8)]
[frame 1/248]
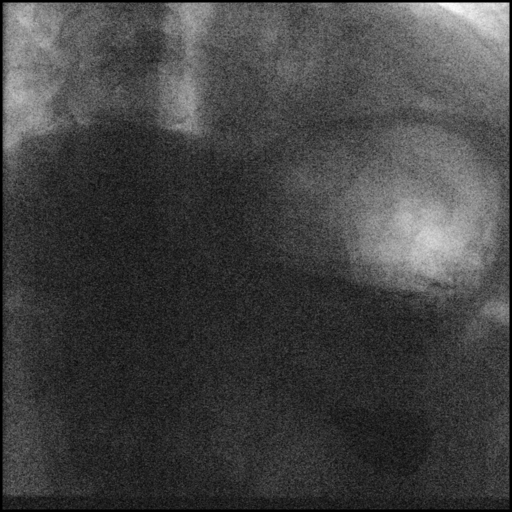
[frame 211/248]
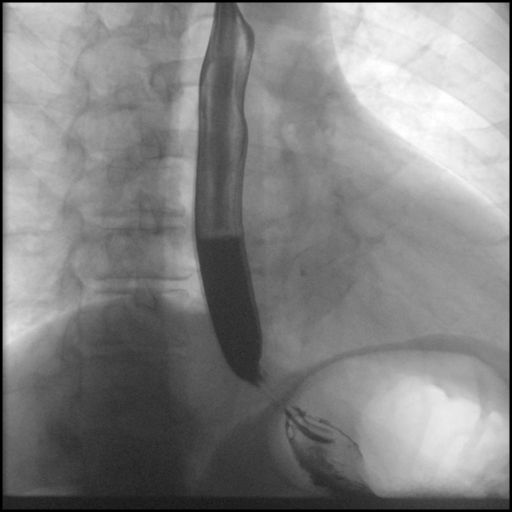

[Series 4: cp_standard · 0.34mm/px · 3 of 64 frames shown (4 of 8)]
[frame 10/64]
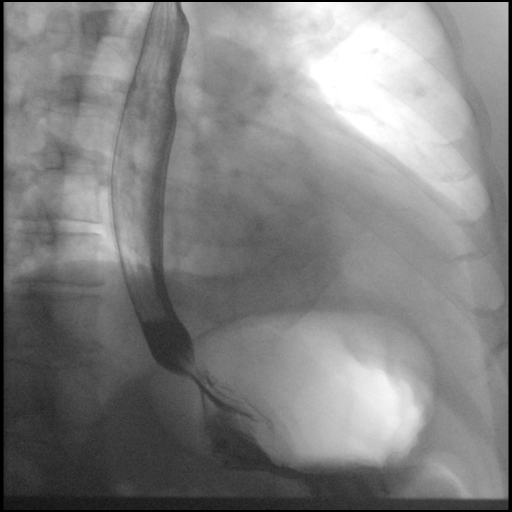
[frame 55/64]
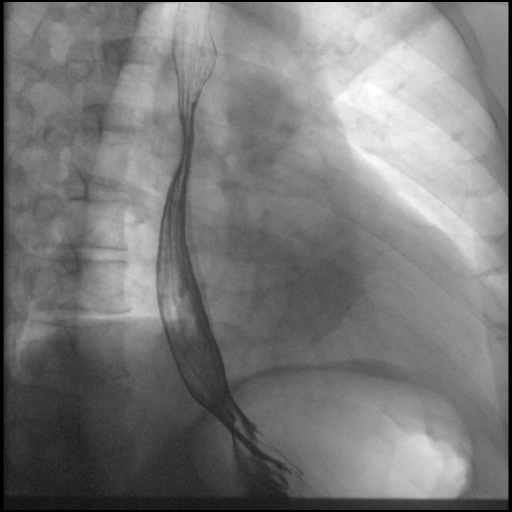
[frame 58/64]
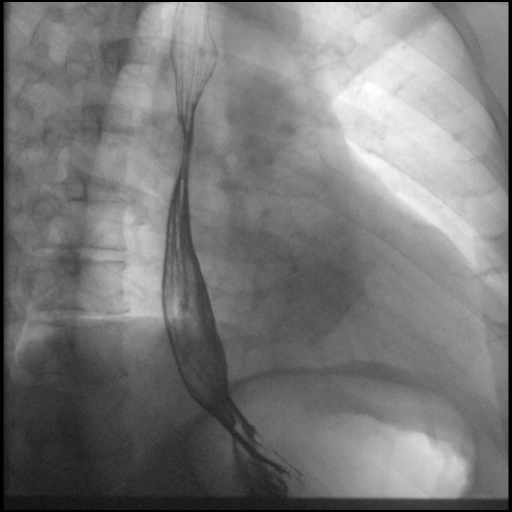

[Series 5: cp_standard · 0.17mm/px · 1 of 1 slices shown (5 of 8)]
[im 1/1]
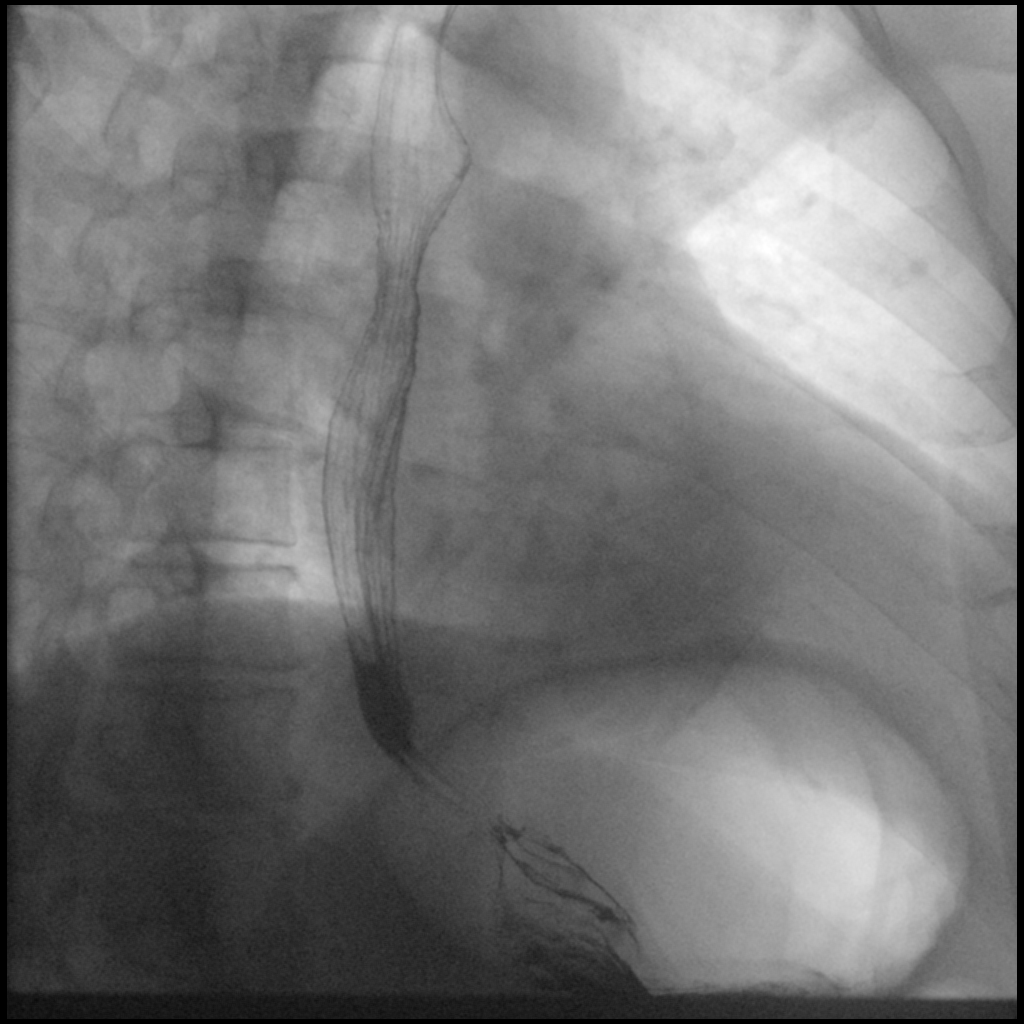

[Series 6: cp_standard · 0.35mm/px · 1 of 170 frames shown (6 of 8)]
[frame 145/170]
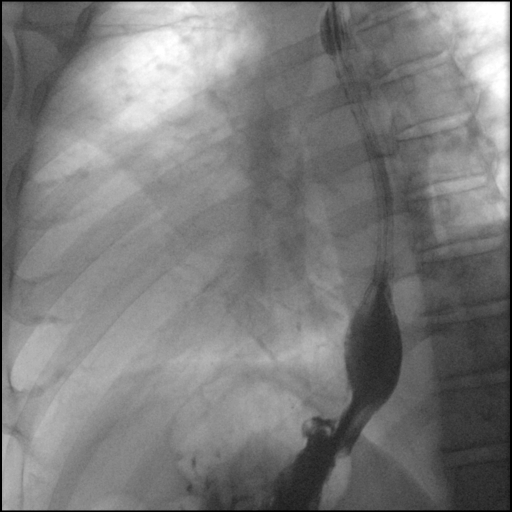

[Series 7: cp_standard · 0.34mm/px · 3 of 134 frames shown (7 of 8)]
[frame 21/134]
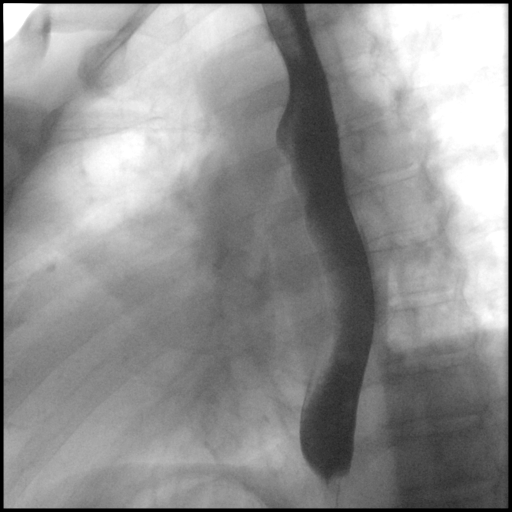
[frame 114/134]
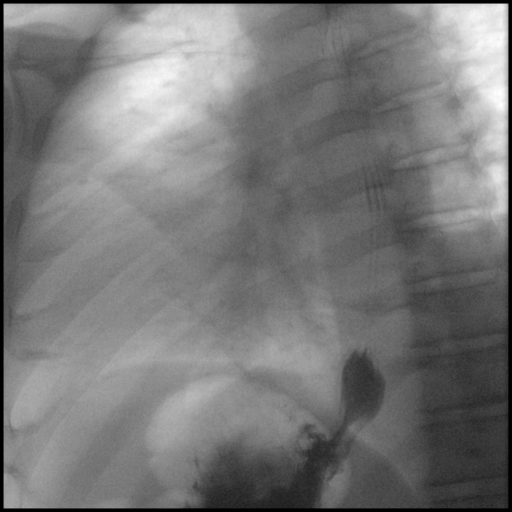
[frame 116/134]
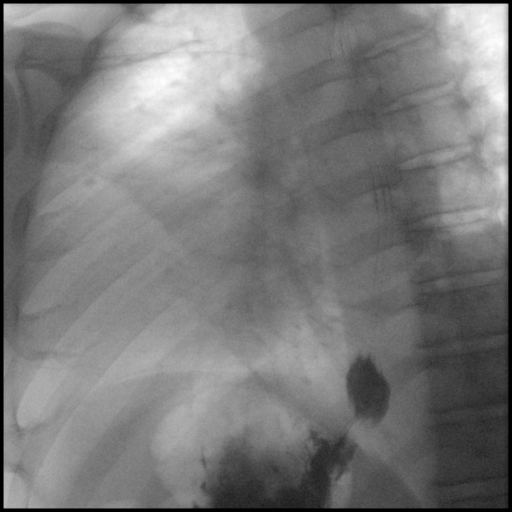

[Series 8: cp_standard · 0.34mm/px · 2 of 77 frames shown (8 of 8)]
[frame 20/77]
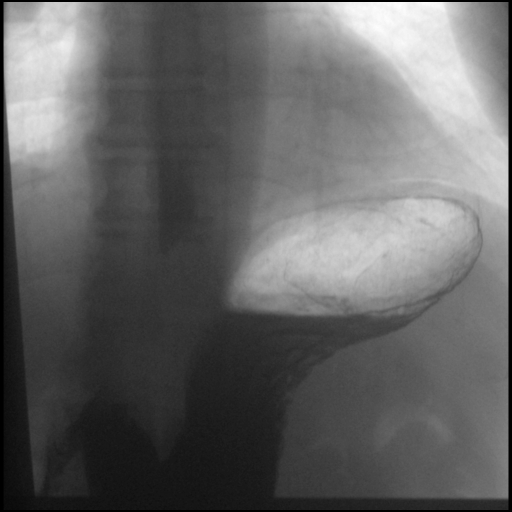
[frame 66/77]
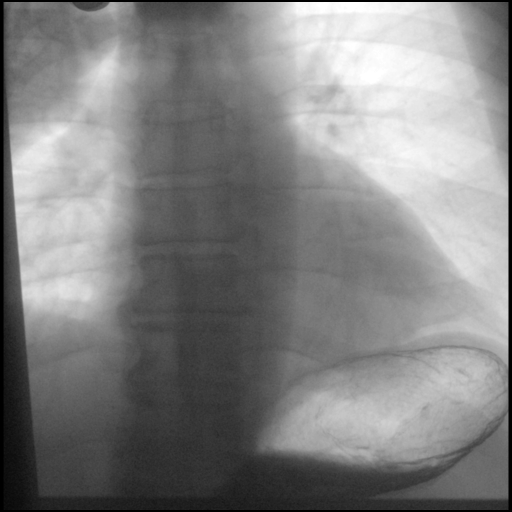

[17 of 24 positions shown; findings below may reference images not displayed]

FINDINGS: Normal esophageal peristalsis. No fixed stricture, fold thickening
or mass. No reflux with the water siphon maneuver. The patient
swallowed a 13 mm barium tablet which freely passed into the
stomach.
IMPRESSION: Normal study.

## 2018-10-03 ENCOUNTER — Telehealth: Payer: Self-pay

## 2018-10-03 NOTE — Telephone Encounter (Signed)
Pt aware.

## 2018-10-03 NOTE — Telephone Encounter (Signed)
-----   Message from Algernon Huxley, RN sent at 04/04/2018  8:59 AM EST ----- Pt needs labs, orders in epic.

## 2018-10-11 ENCOUNTER — Other Ambulatory Visit (INDEPENDENT_AMBULATORY_CARE_PROVIDER_SITE_OTHER): Payer: Medicare Other

## 2018-10-11 DIAGNOSIS — D509 Iron deficiency anemia, unspecified: Secondary | ICD-10-CM | POA: Diagnosis not present

## 2018-10-11 LAB — CBC WITH DIFFERENTIAL/PLATELET
Basophils Absolute: 0 10*3/uL (ref 0.0–0.1)
Basophils Relative: 1 % (ref 0.0–3.0)
Eosinophils Absolute: 0.1 10*3/uL (ref 0.0–0.7)
Eosinophils Relative: 2.3 % (ref 0.0–5.0)
HCT: 33.5 % — ABNORMAL LOW (ref 39.0–52.0)
Hemoglobin: 11.8 g/dL — ABNORMAL LOW (ref 13.0–17.0)
Lymphocytes Relative: 35.5 % (ref 12.0–46.0)
Lymphs Abs: 1.8 10*3/uL (ref 0.7–4.0)
MCHC: 35.1 g/dL (ref 30.0–36.0)
MCV: 90.4 fl (ref 78.0–100.0)
Monocytes Absolute: 0.5 10*3/uL (ref 0.1–1.0)
Monocytes Relative: 10.5 % (ref 3.0–12.0)
Neutro Abs: 2.6 10*3/uL (ref 1.4–7.7)
Neutrophils Relative %: 50.7 % (ref 43.0–77.0)
Platelets: 186 10*3/uL (ref 150.0–400.0)
RBC: 3.7 Mil/uL — ABNORMAL LOW (ref 4.22–5.81)
RDW: 12.4 % (ref 11.5–15.5)
WBC: 5.1 10*3/uL (ref 4.0–10.5)

## 2018-10-11 LAB — IBC + FERRITIN
Ferritin: 144.3 ng/mL (ref 22.0–322.0)
Iron: 109 ug/dL (ref 42–165)
Saturation Ratios: 46.3 % (ref 20.0–50.0)
Transferrin: 168 mg/dL — ABNORMAL LOW (ref 212.0–360.0)

## 2018-10-16 ENCOUNTER — Other Ambulatory Visit: Payer: Self-pay

## 2018-10-16 DIAGNOSIS — D509 Iron deficiency anemia, unspecified: Secondary | ICD-10-CM

## 2019-04-02 ENCOUNTER — Other Ambulatory Visit: Payer: Self-pay | Admitting: Internal Medicine

## 2019-04-02 DIAGNOSIS — K219 Gastro-esophageal reflux disease without esophagitis: Secondary | ICD-10-CM

## 2019-06-05 ENCOUNTER — Other Ambulatory Visit (INDEPENDENT_AMBULATORY_CARE_PROVIDER_SITE_OTHER): Payer: Medicare Other

## 2019-06-05 DIAGNOSIS — D509 Iron deficiency anemia, unspecified: Secondary | ICD-10-CM | POA: Diagnosis not present

## 2019-06-05 LAB — IBC + FERRITIN
Ferritin: 185.1 ng/mL (ref 22.0–322.0)
Iron: 79 ug/dL (ref 42–165)
Saturation Ratios: 31.7 % (ref 20.0–50.0)
Transferrin: 178 mg/dL — ABNORMAL LOW (ref 212.0–360.0)

## 2019-06-06 ENCOUNTER — Other Ambulatory Visit: Payer: Self-pay

## 2019-06-06 DIAGNOSIS — D509 Iron deficiency anemia, unspecified: Secondary | ICD-10-CM

## 2019-06-06 LAB — CBC WITH DIFFERENTIAL/PLATELET
Basophils Absolute: 0 10*3/uL (ref 0.0–0.1)
Basophils Relative: 0.7 % (ref 0.0–3.0)
Eosinophils Absolute: 0.1 10*3/uL (ref 0.0–0.7)
Eosinophils Relative: 2.5 % (ref 0.0–5.0)
HCT: 33.8 % — ABNORMAL LOW (ref 39.0–52.0)
Hemoglobin: 12 g/dL — ABNORMAL LOW (ref 13.0–17.0)
Lymphocytes Relative: 31.2 % (ref 12.0–46.0)
Lymphs Abs: 1.7 10*3/uL (ref 0.7–4.0)
MCHC: 35.4 g/dL (ref 30.0–36.0)
MCV: 90.3 fl (ref 78.0–100.0)
Monocytes Absolute: 0.4 10*3/uL (ref 0.1–1.0)
Monocytes Relative: 8.3 % (ref 3.0–12.0)
Neutro Abs: 3.1 10*3/uL (ref 1.4–7.7)
Neutrophils Relative %: 57.3 % (ref 43.0–77.0)
Platelets: 235 10*3/uL (ref 150.0–400.0)
RBC: 3.74 Mil/uL — ABNORMAL LOW (ref 4.22–5.81)
RDW: 12.7 % (ref 11.5–15.5)
WBC: 5.4 10*3/uL (ref 4.0–10.5)

## 2019-06-25 ENCOUNTER — Other Ambulatory Visit: Payer: Self-pay | Admitting: Internal Medicine

## 2019-06-25 DIAGNOSIS — K219 Gastro-esophageal reflux disease without esophagitis: Secondary | ICD-10-CM

## 2019-06-25 NOTE — Telephone Encounter (Signed)
Patient last seen in office 06/2017. You indicated he needed office follow up in 1 year. However, he had labwork completed 05/2019 at which time you told him he needed repeat in 6 months. Do you want him to have office visit or does he just need to continue to get labs?

## 2019-06-25 NOTE — Telephone Encounter (Signed)
Ok to refill as we are watching lab work

## 2019-12-25 ENCOUNTER — Other Ambulatory Visit: Payer: Self-pay | Admitting: Internal Medicine

## 2019-12-25 DIAGNOSIS — K219 Gastro-esophageal reflux disease without esophagitis: Secondary | ICD-10-CM

## 2020-01-29 ENCOUNTER — Telehealth: Payer: Self-pay

## 2020-01-29 ENCOUNTER — Other Ambulatory Visit: Payer: Self-pay | Admitting: Internal Medicine

## 2020-01-29 DIAGNOSIS — K219 Gastro-esophageal reflux disease without esophagitis: Secondary | ICD-10-CM

## 2020-01-29 NOTE — Telephone Encounter (Signed)
Received pharmacy refill request for Protonix. I called patient and left a message to please call our office to schedule an office visit with Dr. Hilarie Fredrickson, so we can refill his Protonix. Last office visit was 06/26/2017.

## 2020-02-18 ENCOUNTER — Other Ambulatory Visit: Payer: Self-pay | Admitting: Internal Medicine

## 2020-02-18 DIAGNOSIS — K219 Gastro-esophageal reflux disease without esophagitis: Secondary | ICD-10-CM

## 2020-03-10 ENCOUNTER — Other Ambulatory Visit (INDEPENDENT_AMBULATORY_CARE_PROVIDER_SITE_OTHER): Payer: Medicare Other

## 2020-03-10 DIAGNOSIS — D509 Iron deficiency anemia, unspecified: Secondary | ICD-10-CM

## 2020-03-10 LAB — CBC WITH DIFFERENTIAL/PLATELET
Basophils Absolute: 0 10*3/uL (ref 0.0–0.1)
Basophils Relative: 0.4 % (ref 0.0–3.0)
Eosinophils Absolute: 0.1 10*3/uL (ref 0.0–0.7)
Eosinophils Relative: 2 % (ref 0.0–5.0)
HCT: 36.2 % — ABNORMAL LOW (ref 39.0–52.0)
Hemoglobin: 12.6 g/dL — ABNORMAL LOW (ref 13.0–17.0)
Lymphocytes Relative: 26.5 % (ref 12.0–46.0)
Lymphs Abs: 1.8 10*3/uL (ref 0.7–4.0)
MCHC: 34.9 g/dL (ref 30.0–36.0)
MCV: 90.2 fl (ref 78.0–100.0)
Monocytes Absolute: 0.5 10*3/uL (ref 0.1–1.0)
Monocytes Relative: 7.5 % (ref 3.0–12.0)
Neutro Abs: 4.4 10*3/uL (ref 1.4–7.7)
Neutrophils Relative %: 63.6 % (ref 43.0–77.0)
Platelets: 191 10*3/uL (ref 150.0–400.0)
RBC: 4.01 Mil/uL — ABNORMAL LOW (ref 4.22–5.81)
RDW: 12.6 % (ref 11.5–15.5)
WBC: 6.9 10*3/uL (ref 4.0–10.5)

## 2020-03-10 LAB — IBC + FERRITIN
Ferritin: 128.9 ng/mL (ref 22.0–322.0)
Iron: 67 ug/dL (ref 42–165)
Saturation Ratios: 23.9 % (ref 20.0–50.0)
Transferrin: 200 mg/dL — ABNORMAL LOW (ref 212.0–360.0)

## 2020-03-29 ENCOUNTER — Other Ambulatory Visit: Payer: Self-pay | Admitting: Internal Medicine

## 2020-03-29 DIAGNOSIS — K219 Gastro-esophageal reflux disease without esophagitis: Secondary | ICD-10-CM

## 2020-04-05 ENCOUNTER — Other Ambulatory Visit: Payer: Self-pay | Admitting: Internal Medicine

## 2020-04-05 DIAGNOSIS — K219 Gastro-esophageal reflux disease without esophagitis: Secondary | ICD-10-CM

## 2020-04-20 ENCOUNTER — Telehealth: Payer: Self-pay | Admitting: Physician Assistant

## 2020-04-20 DIAGNOSIS — K219 Gastro-esophageal reflux disease without esophagitis: Secondary | ICD-10-CM

## 2020-04-20 MED ORDER — PANTOPRAZOLE SODIUM 40 MG PO TBEC: 40.0000 mg | DELAYED_RELEASE_TABLET | Freq: Two times a day (BID) | ORAL | 0 refills | Status: DC

## 2020-04-20 NOTE — Telephone Encounter (Signed)
Script sent to pharmacy.

## 2020-04-30 ENCOUNTER — Encounter: Payer: Self-pay | Admitting: Physician Assistant

## 2020-04-30 ENCOUNTER — Ambulatory Visit (INDEPENDENT_AMBULATORY_CARE_PROVIDER_SITE_OTHER): Payer: Medicare Other | Admitting: Physician Assistant

## 2020-04-30 VITALS — BP 124/64 | HR 69 | Ht 70.0 in | Wt 163.0 lb

## 2020-04-30 DIAGNOSIS — K5900 Constipation, unspecified: Secondary | ICD-10-CM

## 2020-04-30 DIAGNOSIS — K219 Gastro-esophageal reflux disease without esophagitis: Secondary | ICD-10-CM

## 2020-04-30 MED ORDER — PANTOPRAZOLE SODIUM 40 MG PO TBEC: 40.0000 mg | DELAYED_RELEASE_TABLET | Freq: Two times a day (BID) | ORAL | 3 refills | Status: DC

## 2020-04-30 NOTE — Patient Instructions (Addendum)
Ellouise Newer, PA recommends that you complete a bowel purge (to clean out your bowels). Please do the following: Purchase a bottle of Miralax over the counter as well as a box of 5 mg dulcolax tablets. Take 4 dulcolax tablets. Wait 1 hour. You will then drink 6-8 capfuls of Miralax mixed in an adequate amount of water/juice/gatorade (you may choose which of these liquids to drink) over the next 2-3 hours. You should expect results within 1 to 6 hours after completing the bowel purge.  After you have finished your bowel purge, please remain on Miralax twice daily for several weeks. Then decrease to Miralax once daily long-term.   We have sent the following medications to your pharmacy for you to pick up at your convenience: pantoprazole.   Call our office back if your symptoms are no better after the bowel purge.  Normal BMI (Body Mass Index- based on height and weight) is between 23 and 30. Your BMI today is Body mass index is 23.39 kg/m. [name] Please consider follow up  regarding your BMI with your Primary Care Provider.

## 2020-04-30 NOTE — Progress Notes (Signed)
Chief Complaint: Medication refill  HPI:    George Jacobson is a 70 year old male with a past medical history as listed below including reflux, anemia, angiodysplasia of intestine, right kidney cancer status post nephrectomy, colon polyps, fibromyalgia, GERD and hypertension, who follows with Dr. Hilarie Fredrickson and presents to clinic today for refill of his medication.      04/21/2016 patient seen in clinic for dysphagia, gas and reflux.  At that time recommend the patient use MiraLAX daily and increase pantoprazole to 40 mg twice daily for a couple of weeks.  Ordered a barium swallow for further eval.  Barium swallow was normal.    06/26/2017 office visit with Dr. Hilarie Fredrickson.  At that time was doing well taking pantoprazole 40 mg twice daily.  He had continued MiraLAX daily which worked well.  His iron deficiency anemia was discussed which is felt secondary to small bowel angiodysplastic lesions, good response to oral iron.  He was back down to pantoprazole 40 mg once a day as he is doing well.  Continued MiraLAX, but alternated 8 g with 17 g every other day.  As discussed surveillance colonoscopy was due in September.    04/03/2018 colonoscopy with a 4 mm polyp in the sigmoid colon and hemorrhoids.  Pathology showed tubular adenoma repeat recommended in 7 years.    Today, the patient tells me that "I got myself in a big mess", apparently he went to half a dose of Miralax every other day per recommendations from Dr. Hilarie Fredrickson at last visit and then eventually just went to every other day dosing and then stopped his MiraLAX completely because he was doing okay, but then over the past month has noticed that his bowel movements have decreased again, tells me he restarted MiraLAX a few days ago, but has not really had a good bowel movement in at least 2 weeks, he is passing gas.  Describes some lower abdominal discomfort which radiates through into his back.    Reflux well controlled on Pantoprazole 40 mg twice daily.    Recently  had his 8th wedding anniversary with his wife.    Denies fever, chills, blood in his stool, nausea or vomiting.  Past Medical History:  Diagnosis Date  . Anemia    DEC 2012 -REQUIRED TRANSFUSIONS AND ANEMIA AND TRANSFUSIONS AGAIN FEB 2013--DX WITH "GAVE"  . Angiodysplasia of intestine   . Arthritis    generalized  . Blood transfusion   . Blood transfusion without reported diagnosis 10/2013  . Cancer ([place]) 10/2010   right kidney cancer  . Celiac disease   . Colon polyps    10/2012  . Depression   . Diverticulosis   . Fibromyalgia   . GERD (gastroesophageal reflux disease)   . GI bleeding   . Headache(784.0)   . Hypertension   . IDA (iron deficiency anemia)   . Pneumonia    DEC 2012 HOSP AT [place]  . 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  . Sleep apnea    TEST WAS YRS AGO-DID USE CPAP FOR A WHILE--BUT NO LONGER USES--AND DOES NOT FEEL SLEEPY DURING THE DAY TIME ANYMORE  . Tubular adenoma of colon    10/2012  . 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 COLONOSCOPY N/A 10/29/2012   Procedure:  COLONOSCOPY;  Surgeon: Irene Shipper, MD;  Location: [facility];  Service: Endoscopy;  Laterality: N/A;  . COLONOSCOPY    . ESOPHAGOGASTRODUODENOSCOPY  03/23/2011   Procedure: ESOPHAGOGASTRODUODENOSCOPY (EGD);  Surgeon: Zenovia Jarred, MD;  Location: [facility];  Service: Gastroenterology;  Laterality: N/A;  . ESOPHAGOGASTRODUODENOSCOPY N/A 10/29/2012   Procedure: ESOPHAGOGASTRODUODENOSCOPY (EGD);  Surgeon: Irene Shipper, MD;  Location: [facility];  Service: Endoscopy;  Laterality: N/A;  . FOOT SURGERY  2000   both foot sx for bunion  . GIVENS CAPSULE STUDY  03/24/2011   Procedure: GIVENS CAPSULE STUDY;  Surgeon: Zenovia Jarred, MD;  Location: [facility];  Service:  Gastroenterology;  Laterality: N/A;  . [place]   left   . HOT HEMOSTASIS N/A 10/29/2012   Procedure: HOT HEMOSTASIS (ARGON PLASMA COAGULATION/BICAP);  Surgeon: Irene Shipper, MD;  Location: [facility];  Service: Endoscopy;  Laterality: N/A;  . INNER EAR SURGERY  1984   right  ear  . LAPAROSCOPIC NEPHRECTOMY  10/2010   total   . POLYPECTOMY    . VARICOSE VEIN SURGERY  1986    Current Outpatient Medications  Medication Sig Dispense Refill  . DULoxetine (CYMBALTA) 60 MG capsule Take by mouth.    . ferrous sulfate 325 (65 FE) MG tablet Take by mouth.    . finasteride (PROSCAR) 5 MG tablet Take 5 mg by mouth daily.    . folic acid (FOLVITE) 378 MCG tablet Take 800 mcg by mouth daily.    [name] HYDROcodone-acetaminophen (NORCO) 7.5-325 MG tablet Take 1 tablet by mouth 4 (four) times daily as needed for moderate pain.    [name] losartan (COZAAR) 50 MG tablet Take 50 mg by mouth daily.    . Multiple Vitamins-Minerals (QC MENS DAILY MULTIVITAMIN PO) Take 1 tablet by mouth daily.    . pantoprazole (PROTONIX) 40 MG tablet Take 1 tablet (40 mg total) by mouth 2 (two) times daily. NEEDS OFFICE VISIT FOR FURTHER REFILLS 60 tablet 0  . polyethylene glycol powder (GLYCOLAX/MIRALAX) powder DISSOLVE 17 GRAMS IN AT LEAST 8 OUNCES OF WATER OR JUICE AND DRINK DAILY 527 g 1  . pregabalin (LYRICA) 100 MG capsule Take 100 mg by mouth 2 (two) times daily.    . simvastatin (ZOCOR) 40 MG tablet Take 40 mg by mouth every morning.     . tamsulosin (FLOMAX) 0.4 MG CAPS capsule Take 0.4 mg by mouth daily after supper.     No current facility-administered medications for this visit.    Allergies as of 04/30/2020 - Review Complete 04/30/2020  Allergen Reaction Noted  . Sulfa antibiotics  05/31/2010  . Tetracyclines & related  03/21/2011    Family History  Problem Relation Age of Onset  . Hypertension Father   . Breast cancer Sister   . Colon cancer Neg Hx   . Colon polyps Neg Hx   . Rectal cancer Neg Hx    . Stomach cancer Neg Hx   . Esophageal cancer Neg Hx     Social History   Socioeconomic History  . Marital status: Married    Spouse name: Not on file  . Number of children: 2  . Years of education: Some college  . Highest education level: Not on file  Occupational History  . Occupation: Information systems manager: [employer]  Tobacco Use  . Smoking status: Former Smoker    Packs/day: 1.00    Years: 25.00    Pack years: 25.00    Types: Cigarettes  Quit date: 02/14/1998    Years since quitting: 22.2  . Smokeless tobacco: Never Used  Vaping Use  . Vaping Use: Never used  Substance and Sexual Activity  . Alcohol use: Not Currently  . Drug use: No  . Sexual activity: Not on file  Other Topics Concern  . Not on file  Social History Narrative   Lives in [place] with his wife, has two distributing businesses and at baseline still quite active. Semi-retired. Have two children, and 2 grandchildren. No cane or walker. George Jacobson = wife.    Left-handed   Caffeine: 2-3 cups of coffee and about 4 cups of tea per day   Social Determinants of Health   Financial Resource Strain: Not on file  Food Insecurity: Not on file  Transportation Needs: Not on file  Physical Activity: Not on file  Stress: Not on file  Social Connections: Not on file  Intimate Partner Violence: Not on file    Review of Systems:    Constitutional: No weight loss, fever or chills Cardiovascular: No chest pain Respiratory: No SOB  Gastrointestinal: See HPI and otherwise negative   Physical Exam:  Vital signs: BP 124/64   Pulse 69   Ht 5\' 10"  (1.778 m)   Wt 163 lb (73.9 kg)   SpO2 98%   BMI 23.39 kg/m   Constitutional:   Pleasant Caucasian male appears to be in NAD, Well developed, Well nourished, alert and cooperative Respiratory: Respirations even and unlabored. Lungs clear to auscultation bilaterally.   No wheezes, crackles, or rhonchi.  Cardiovascular: Normal S1, S2. No MRG. Regular rate  and rhythm. No peripheral edema, cyanosis or pallor.  Gastrointestinal:  Soft, nondistended, mild lower abdominal TTP. No rebound or guarding.  Decreased bowel sounds all 4 quadrants. No appreciable masses or hepatomegaly. Rectal:  Not performed.  Psychiatric: Demonstrates good judgement and reason without abnormal affect or behaviors.  No recent imaging.  Assessment: 1.  Constipation: Previously controlled on MiraLAX daily, patient stopped this and has now had 2 weeks of constipation 2.  GERD: Controlled on Pantoprazole 40 mg twice daily  Plan: 1.  Recommend the patient do a MiraLAX bowel purge.  After this he should restart MiraLAX twice daily for at least a couple of weeks and then can likely back down to once daily dosing again.  He should call our clinic if he is not having results after trying this over the next 48 hours.  If he is not then would recommend Suprep. 2.  Refilled patient's Pantoprazole 40 mg twice daily, 30-60 minutes before breakfast and dinner #180 with 3 refills. 3.  Patient return to clinic as needed or in a year for refills.  Ellouise Newer, PA-C [facility] 04/30/2020, 9:50 AM  Cc: [name], MD

## 2020-05-01 ENCOUNTER — Telehealth: Payer: Self-pay | Admitting: Physician Assistant

## 2020-05-01 NOTE — Telephone Encounter (Signed)
Left message for medical records at [facility] health to fax over MRI report from yesterday. Will fax a request on our letter head as well, fax is [phone].   Patient is aware that we will await the results and if everything is fine we can proceed with the bowel prep. He is aware that if he becomes nauseated or has worsening abdominal pain he will need to go to the ED. Patient states that he will come to [facility] if he needs to.

## 2020-05-01 NOTE — Telephone Encounter (Signed)
Spoke with patient he states that he did the purge last night with no relief, he states that he was only able to pass "little drips and streams running down his legs." Reports abdominal pain that gets bad at times. He states that he went to  Abdominal pain bad at times. He states that he went to a "so called" emergency room in [place] and they did an MRI but that was fine and they couldn't do anything else for him. Based on office note from yesterday, "If he is not then would recommend Suprep." Okay to send Suprep for patient? Please advise, thanks.

## 2020-05-01 NOTE — Telephone Encounter (Signed)
Inbound call from patient requesting a call back please.  States he is not feeling well and wants to know if he should go to the ED.  Please advise.

## 2020-05-01 NOTE — Telephone Encounter (Signed)
He had an MRI? When? Can we get a copy of this?  I guess if MRI was fine, then would recommend a bowel prep to see if we can really clean him out. If he get nauseaous or has increasing abdominal pain then he may need to go to the ER.  Thanks-JLL

## 2020-05-04 ENCOUNTER — Telehealth: Payer: Self-pay | Admitting: Physician Assistant

## 2020-05-04 NOTE — Telephone Encounter (Signed)
Anderson Malta, received the report. It was a CT scan that was performed. Records will be in your box. Thanks

## 2020-05-04 NOTE — Telephone Encounter (Signed)
Spoke with patient, he is aware that we have received the CT results and once George Jacobson reviews them we will let him know if it is okay to proceed with bowel prep. Advised patient to increase Miralax to 3 times a day, he also states that he is taking stool softeners. He states that he is on a light diet. Advised that George Jacobson will be in the office tomorrow and I will let him know once she advises that it is okay to proceed. Patient verbalized understanding and had no further concerns.

## 2020-05-05 ENCOUNTER — Telehealth: Payer: Self-pay

## 2020-05-05 ENCOUNTER — Other Ambulatory Visit: Payer: Self-pay | Admitting: Physician Assistant

## 2020-05-05 NOTE — Telephone Encounter (Signed)
-----   Message from Levin Erp, [place] sent at 05/05/2020 10:52 AM EDT ----- Regarding: Follow up Can you call patient and see how he is doing after he underwent Suprep.  I just reviewed his CT, did show fecalith in the rectum which is likely making things harder to pass by.  Hopefully this is resolved by now.  Thanks-JLL

## 2020-05-05 NOTE — Telephone Encounter (Signed)
Spoke with patient, he states that he is not really sure how his bowels are yet. He states that he thinks he is making progress and has had some success, he would like to give [place] a call back on Thursday with an update before he proceeds with the bowel prep.

## 2020-05-05 NOTE — Progress Notes (Signed)
[facility]  05/05/2020 10:50 AM  Patient ended up having a CT of the abdomen pelvis with contrast in [place] on 04/30/2020.  This showed no definite bowel obstruction, though a moderate fecalith in the rectum.  Multiple low-density areas in the liver, likely cysts, previous right nephrectomy, small right inguinal hernia.  We will contact the patient again today to see how he is doing, after receiving these results patient was supposed to undergo a bowel purge with Suprep.  I will have my nurse call him today.   Ellouise Newer, PA-C

## 2020-05-07 NOTE — Progress Notes (Signed)
Addendum: Reviewed and agree with assessment and management plan. Billal Rollo M, MD  

## 2021-03-10 ENCOUNTER — Other Ambulatory Visit: Payer: Self-pay | Admitting: Physician Assistant

## 2021-03-10 DIAGNOSIS — K219 Gastro-esophageal reflux disease without esophagitis: Secondary | ICD-10-CM

## 2021-07-08 ENCOUNTER — Other Ambulatory Visit: Payer: Self-pay | Admitting: Physician Assistant

## 2021-07-08 DIAGNOSIS — K219 Gastro-esophageal reflux disease without esophagitis: Secondary | ICD-10-CM

## 2021-07-28 ENCOUNTER — Other Ambulatory Visit: Payer: Medicare Other

## 2021-09-08 ENCOUNTER — Encounter: Payer: Self-pay | Admitting: Physician Assistant

## 2021-09-08 ENCOUNTER — Telehealth: Payer: Self-pay | Admitting: Physician Assistant

## 2021-09-08 DIAGNOSIS — K219 Gastro-esophageal reflux disease without esophagitis: Secondary | ICD-10-CM

## 2021-09-08 MED ORDER — PANTOPRAZOLE SODIUM 40 MG PO TBEC: 40.0000 mg | DELAYED_RELEASE_TABLET | Freq: Two times a day (BID) | ORAL | 0 refills | Status: AC

## 2021-09-08 NOTE — Telephone Encounter (Signed)
Script sent to pharmacy.

## 2021-11-30 ENCOUNTER — Other Ambulatory Visit: Payer: Self-pay | Admitting: Physician Assistant

## 2021-11-30 DIAGNOSIS — K219 Gastro-esophageal reflux disease without esophagitis: Secondary | ICD-10-CM

## 2021-11-30 NOTE — Telephone Encounter (Signed)
Patient needs an appointment for future refills.
